# Patient Record
Sex: Male | Born: 1970 | Race: White | Hispanic: No | Marital: Married | Smoking: Never smoker
Health system: Southern US, Community
[De-identification: ages and names within clinical notes are randomized; demographics above are authoritative.]

## PROBLEM LIST (undated history)

## (undated) DIAGNOSIS — E8881 Metabolic syndrome: Secondary | ICD-10-CM

## (undated) DIAGNOSIS — G4700 Insomnia, unspecified: Secondary | ICD-10-CM

## (undated) DIAGNOSIS — N200 Calculus of kidney: Secondary | ICD-10-CM

## (undated) DIAGNOSIS — E781 Pure hyperglyceridemia: Secondary | ICD-10-CM

## (undated) DIAGNOSIS — E669 Obesity, unspecified: Secondary | ICD-10-CM

## (undated) DIAGNOSIS — I10 Essential (primary) hypertension: Secondary | ICD-10-CM

## (undated) DIAGNOSIS — E7800 Pure hypercholesterolemia, unspecified: Secondary | ICD-10-CM

## (undated) DIAGNOSIS — K5792 Diverticulitis of intestine, part unspecified, without perforation or abscess without bleeding: Secondary | ICD-10-CM

## (undated) DIAGNOSIS — E039 Hypothyroidism, unspecified: Secondary | ICD-10-CM

## (undated) HISTORY — DX: Calculus of kidney: N20.0

## (undated) HISTORY — PX: WISDOM TOOTH EXTRACTION: SHX21

## (undated) HISTORY — DX: Essential (primary) hypertension: I10

## (undated) HISTORY — DX: Metabolic syndrome: E88.810

## (undated) HISTORY — DX: Metabolic syndrome: E88.81

## (undated) HISTORY — PX: TONSILLECTOMY: SUR1361

## (undated) HISTORY — DX: Diverticulitis of intestine, part unspecified, without perforation or abscess without bleeding: K57.92

## (undated) HISTORY — DX: Pure hyperglyceridemia: E78.1

## (undated) HISTORY — PX: COLONOSCOPY: SHX174

## (undated) HISTORY — DX: Insomnia, unspecified: G47.00

---

## 2003-05-17 ENCOUNTER — Encounter: Payer: Self-pay | Admitting: Emergency Medicine

## 2003-05-17 ENCOUNTER — Emergency Department (HOSPITAL_COMMUNITY): Admission: EM | Admit: 2003-05-17 | Discharge: 2003-05-17 | Payer: Self-pay | Admitting: Emergency Medicine

## 2004-05-24 ENCOUNTER — Ambulatory Visit (HOSPITAL_BASED_OUTPATIENT_CLINIC_OR_DEPARTMENT_OTHER): Admission: RE | Admit: 2004-05-24 | Discharge: 2004-05-24 | Payer: Self-pay | Admitting: Family Medicine

## 2004-07-17 ENCOUNTER — Ambulatory Visit (HOSPITAL_COMMUNITY): Admission: RE | Admit: 2004-07-17 | Discharge: 2004-07-17 | Payer: Self-pay | Admitting: Gastroenterology

## 2004-07-17 ENCOUNTER — Encounter (INDEPENDENT_AMBULATORY_CARE_PROVIDER_SITE_OTHER): Payer: Self-pay | Admitting: Specialist

## 2004-11-14 ENCOUNTER — Observation Stay (HOSPITAL_COMMUNITY): Admission: EM | Admit: 2004-11-14 | Discharge: 2004-11-15 | Payer: Self-pay | Admitting: Emergency Medicine

## 2006-03-19 ENCOUNTER — Emergency Department (HOSPITAL_COMMUNITY): Admission: EM | Admit: 2006-03-19 | Discharge: 2006-03-20 | Payer: Self-pay | Admitting: Emergency Medicine

## 2007-05-16 ENCOUNTER — Emergency Department (HOSPITAL_COMMUNITY): Admission: EM | Admit: 2007-05-16 | Discharge: 2007-05-16 | Payer: Self-pay | Admitting: Emergency Medicine

## 2011-05-11 NOTE — Discharge Summary (Signed)
NAME:  Michael Liu, Michael Liu                 ACCOUNT NO.:  [id number]   MEDICAL RECORD NO.:  [id number]          PATIENT TYPE:  INP   LOCATION:  0375                         FACILITY:  [facility]   PHYSICIAN:  Melissa L. Ladona Ridgel, MD  DATE OF BIRTH:  06-03-1971   DATE OF ADMISSION:  11/14/2004  DATE OF DISCHARGE:  11/15/2004                                 DISCHARGE SUMMARY   DISCHARGE DIAGNOSES:  1.  Atypical chest pain, not of cardiac origin.  2.  Hypothyroidism, newly diagnosed.  3.  Hypercholesterolemia and hypertriglyceridemia.  4.  Hypertension.   MEDICATIONS AT DISCHARGE:  1.  Pepcid 20 mg b.i.d.  2.  Synthroid 0.05 mg q.d.  3.  TriCor 48 mg p.o. q.d.  4.  Accupril 20 mg q.d.   DIET:  Low fat, low cholesterol, low sodium diet.   SPECIAL INSTRUCTIONS:  Call your family doctor if you develop any recurrent  chest pain or any new changes that you feel may be related to your new  medications.   FOLLOW UP:  Follow up should be with your primary care physician in 1 to 2  weeks. Follow up with your cardiologist, Dr. Reyes Ivan, should be 3 to 4  months. Should attempt to obtain outpatient followup with an endocrinologist  or your primary care physician. Please also ask to have your hemoglobin A1c  checked as an outpatient.   HISTORY OF PRESENT ILLNESS:  The patient is a 40 year old male who presented  to the emergency room on the day of admission with chest pain. States that  the pain has been on and off for the last 2 to 3 days but felt more of a  pressure sensation as if someone's foot was on his chest. The discomfort was  relieved with nitroglycerin in the emergency room. He was therefore admitted  to telemetry for further evaluation. The patient was seen by cardiology, and  it was determined that he should perform a stress echocardiogram. Stress  echocardiogram was performed during the hospital. He was found to have a  hyperdynamic ventricle with good exercise tolerance and no evidence for  ischemia.   Pertinent laboratory values during the course of the hospitalization reveal  an elevated thyroid  stimulating hormone at 11.742 with a T4 of 6.6 within  the normal limits. The patient was also evaluated for pulmonary embolus as  the D-dimer was less than 0.22, and a CT of the chest was performed showing  no evidence for pulmonary emboli. On the day of discharge, the patient's  vital signs were stable. He was pain free.   PHYSICAL EXAMINATION:  VITAL SIGNS:  His temperature was 97.8, blood  pressure 109/79 with a pulse of 88, respirations 22. Saturations were 98%.  GENERAL:  He is a mildly obese white male in no acute distress.  HEENT: Pupils are equal, round, and reactive to light. Extraocular muscles  are intact. Mucous membranes are moist.  NECK:  Supple. There is no JVD. No lymph nodes. The left thyroid lobe did  appear slightly irregular and enlarged compared to the right with potential  palpable  nodular areas. Chest x-ray was clear to auscultation. There was no  rhonchi, rales, or wheezes.  CARDIOVASCULAR:  Regular rate and rhythm. Positive S1 and S2. No S3 or S4.  No murmurs, rubs, or gallops.  ABDOMEN:  Mildly obese, soft, nontender, nondistended with positive bowel  sounds.  EXTREMITIES:  Showed no clubbing, cyanosis, or edema.  NEUROLOGICAL:  He was nonfocal and intact.   PERTINENT LABORATORY DATA:  Pertinent laboratory values during the course of  the hospital stay reveal no elevations in his cardiac enzymes. As stated,  his TSH was 11.742. His lipid panel was 187 for total cholesterol,  triglycerides 478, his HDL was 22, and his LDL could not be calculated  because of his elevated triglycerides. His white count on admission was 7.0  with a hemoglobin of 15.1 and hematocrit of 43.2 and platelets of 200. His  sodium was 139, potassium 3.7, BUN 16, creatinine 0.9, glucose 74. His LFTs  were within normal limits.   HOSPITAL COURSE:  The patient's stress  echocardiogram was within normal  limits, and therefore, it was felt that his chest discomfort was  musculoskeletal in origin or related to gastroesophageal reflux disease. He  was therefore started on a trial of Pepcid. Since discovering his high TSH  and discussing with him symptoms that were consistent with possible  subclinical hypothyroidism, decision was made to start him on Synthroid and  have an followup endocrinology visit as an outpatient. The patient was also  started on TriCor 48 mg p.o. q.d. for his elevated triglycerides and  continued on his ACE inhibitor 20 mg q.d.   This patient was deemed stable for discharge to follow up with his primary  care physician as an outpatient and establish a relationship with an  endocrinologist to further workup his thyroid disease. I would recommend  this patient have a repeat thyroid panel in 4 to 6 weeks and that a  hemoglobin A1c be drawn to rule out glucose intolerance.   CONDITION ON DISCHARGE:  Stable.     Meli   MLT/MEDQ  D:  11/16/2004  T:  11/16/2004  Job:  [id number]   cc:   Clovis Riley, M.D.  [facility]  [facility]

## 2011-05-11 NOTE — H&P (Signed)
NAME:  Michael Liu, Michael Liu                 ACCOUNT NO.:  [id number]   MEDICAL RECORD NO.:  [id number]          PATIENT TYPE:  EMS   LOCATION:  ED                           FACILITY:  [facility]   PHYSICIAN:  Deirdre Peer. Polite, M.D. DATE OF BIRTH:  July 09, 1971   DATE OF ADMISSION:  11/14/2004  DATE OF DISCHARGE:                                HISTORY & PHYSICAL   CHIEF COMPLAINT:  Chest pain.   HISTORY OF PRESENT ILLNESS:  A 40 year old gentleman with a known history of  hypertension and obesity who presents to the ED with complaints of chest  pain.  Patient states that the chest pain has been off and on for the last 2-  3 days.  Patient describes it as a pressure sensation, as if someone's foot  is on his chest.  He does think that he occasionally feels some  palpitations.  He denies any diaphoresis.  He denies any radiation to the  arm.  Patient states that these symptoms are worsened by taking a deep  breath.  Because of the persistent nature of the symptoms, the patient  presented to the ED for evaluation.  In the ED, the patient was evaluated.  The patient had point-of-care enzymes within normal limits.  EKG without  acute abnormality.  A chest x-ray without infiltrate.  [facility]  were called for further evaluation because of the patient's description of  his chest pain and concerns for acute coronary syndrome.  At the time of my  evaluation, the patient is alert and oriented x 3.  He stated that the  pressure sensation has improved to approximately a 1/10 since being started  on IV nitroglycerin.  Patient denies any known history of coronary artery  disease or any family history of coronary artery disease.  Patient admits  that he has been able to exercise rather vigorously up until September.  Patient states that he was in a spinning class riding a bike for greater  than 30 minutes at a time without any difficulties.  He denies any orthopnea  or PND.  He denies any dyspnea on  exertion.  He denies any ankle edema.  Again, patient denies any family history of coronary artery disease.  Admission is deemed necessary for further evaluation and treatment of the  chest pain to rule out coronary artery disease.   PAST MEDICAL HISTORY:  1.  Hypertension.  2.  Low HDL.  3.  Occasional insomnia.   MEDICATIONS:  1.  Quinapril 10 mg q.d.  2.  Lunesta p.r.n.   SOCIAL HISTORY:  Negative for tobacco, alcohol, or drugs.   PAST SURGICAL HISTORY:  Negative.   ALLERGIES:  None.   FAMILY HISTORY:  Father with hypertension.  Mother with hypertension and  high cholesterol.  A sister with gestational diabetes and hypertension.  Patient has one brother.  He is unsure of what his medical condition is.   REVIEW OF SYSTEMS:  As stated in the HPI.   PHYSICAL EXAMINATION:  VITAL SIGNS:  Temperature 97.9, BP 126/96, pulse 81,  respiratory rate 20.  Sats 97%.  GENERAL:  Patient is alert and oriented x3 in no apparent distress.  HEENT:  Pupils are equal, round and reactive to light.  Anicteric sclerae.  No oral lesions.  NECK:  No nodes.  No JVD.  No carotid bruits appreciated.  LUNGS:  Clear to auscultation bilaterally.  No rales, rhonchi or rub.  CHEST:  Please note with palpation on the patient's sternum, there is  reproducible chest pain.  CARDIOVASCULAR:  Regular with no S3.  ABDOMEN:  Nontender.  EXTREMITIES:  No clubbing, cyanosis or edema.  Pulses 2+.  NEUROLOGIC:  Nonfocal.   DATA:  Chest x-ray with no apparent disease.   EKG:  No acute changes.   BMET within normal limits.  CBC within normal limits.  INR 0.8.  LFTs within  normal limits.  D-dimer pending at the time of this dictation.   ASSESSMENT:  1.  Chest pain which is reproducible by palpation, worsened by deep breath.      Also of note, the patient did get relief with sublingual nitroglycerin,      which is somewhat worrisome for coronary artery disease.  2.  Obesity.  3.  Hypertension.   Recommend  patient be admitted to a telemetry floor bed for evaluation and  treatment of chest pain to rule out acute coronary syndrome.  Will continue  serial cardiac enzymes.  Will check a TSH, check a lipid panel, check a D-  dimer.  As the patient's symptoms are worsened by deep inspiration, will  obtain a CT of the chest to rule out PE.  If the above tests are negative,  we will have cardiac evaluate for risk stratification, possibly stress  testing.  Will make further recommendations after review of the above  studies.     Joen Laura   RDP/MEDQ  D:  11/14/2004  T:  11/14/2004  Job:  [id number]

## 2011-05-11 NOTE — Op Note (Signed)
NAME:  Michael Liu, Michael Liu NO.:  [id number]   MEDICAL RECORD NO.:  [id number]                   PATIENT TYPE:  AMB   LOCATION:  ENDO                                 FACILITY:  [facility]   PHYSICIAN:  James L. Malon Kindle., M.D.          DATE OF BIRTH:  03-May-1971   DATE OF PROCEDURE:  07/17/2004  DATE OF DISCHARGE:                                 OPERATIVE REPORT   PROCEDURE:  Colonoscopy and biopsy.   MEDICATIONS:  Fentanyl 100 mcg, Versed 10 mg IV.   SCOPE:  Olympus pediatric colonoscope.   INDICATIONS:  Rectal bleeding and diarrhea of new onset.   DESCRIPTION OF PROCEDURE:  The procedure had been explained to the patient  and consent obtained.  The patient was placed in the left lateral decubitus  position.  The Olympus scope was inserted and advanced.  A digital exam was  performed.  The scope was withdrawn after the cecum was reached.  The  ileocecal valve and appendiceal orifice were seen.  The mucosa was  completely normal throughout.  The ascending, transverse, descending, and  sigmoid colon were seen well.  No evidence of colitis.  A good vascular  pattern.  No diverticular disease.  No polyps were seen.  Multiple random  biopsies were obtained.  The rectum was free of polyps.  There were internal  hemorrhoids seen in the rectum.  The scope was withdrawn, and the patient  tolerated the procedure well.   ASSESSMENT:  1. Rectal bleeding, possibly due to internal hemorrhoids.  578.1.  2. Diarrhea with normal mucosa endoscopically.  Biopsied to rule out occult     colitis.   PLAN:  Will check path.  Give a hemorrhoid instructions sheet.  See back in  the office in 4-6 weeks.                                               James L. Malon Kindle., M.D.    Waldron Session  D:  07/17/2004  T:  07/17/2004  Job:  [id number]   cc:   Sigmund Hazel, M.D.  [address]  [address] [place], [place] [address]  Fax: [phone]

## 2011-05-11 NOTE — Consult Note (Signed)
NAME:  Michael Liu, Michael Liu                 ACCOUNT NO.:  [id number]   MEDICAL RECORD NO.:  [id number]          PATIENT TYPE:  INP   LOCATION:  0101                         FACILITY:  [facility]   PHYSICIAN:  Elmore Guise., M.D.DATE OF BIRTH:  [birth year]   DATE OF CONSULTATION:  11/14/2004  DATE OF DISCHARGE:                                   CONSULTATION   REASON FOR CONSULTATION:  Chest pain.   HISTORY OF PRESENT ILLNESS:  Patient is a very pleasant 40 year old white  male with a past medical history of recently diagnosed hypertension.  Presents with an eight-month history of inspiratory off and on chest pain.  The patient's chest pain started approximately in the end of February when  he was initially diagnosed with bronchitis.  Does report some improvement  after treatment with an unknown antibiotic; however, the pain became  recurrent over the last couple of months.  Notes it was worse with  activity and worse with deep breathing.  He used to exercise approximately 4-  5 times per week with hard aerobic activity, either riding a bike or  treadmill or other aerobic activities; however, he has lessened my activity  level over the last couple of months.  Today he had recurrent episodes of  chest pain, again, worse with deep inspiration or with increased stress  levels, primarily stress with business or emotional stress.  Denies any  significant exertional component with normal ambulation.  Reports pain is  also worse with heavy lifting.  His chest pain also is off and on and worse  with palpation to the right upper sternal area as well as mild decrease in  catch in inspiratory capacity with pain in the lower left thoracic area.  Patient is currently chest painfree; however, has active pain with continued  deep inspiration.   REVIEW OF SYSTEMS:  Positive for recent scuba diving trip in October,  otherwise negative.  Has recently started on Quinapril from his primary care  physician.   CURRENT MEDICATIONS:  Accupril 10 mg daily.   ALLERGIES:  None.   FAMILY HISTORY:  Positive for hypertension.   SOCIAL HISTORY:  Married with one child.  No tobacco.  Has occasional  alcohol use, approximately 1-2 times per year.  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   PHYSICAL EXAMINATION:  VITAL SIGNS:  He is afebrile.  Blood pressure 120-  130/60-70.  Heart rate is in the 70s.  Satting at 100% on room air.  GENERAL:  He is a very pleasant young white male who is alert and oriented x  4 in no acute distress.  NECK:  Supple.  No lymphadenopathy.  Carotids are 2+.  No JVD.  No bruits.  LUNGS:  Clear.  HEART:  Regular with a normal S1 and S2.  No rub noted.  ABDOMEN:  Obese, soft, nontender, nondistended.  EXTREMITIES:  Warm with 2+ pulses.  No edema.   Lab work shows a white count of 7.0, hemoglobin 15.1, platelets 200.  BUN  and creatinine are 16 and 0.9.  Potassium is 3.7.  LFTs  are normal.  Coags  are normal.  D-dimer is less than 0.22.  MB was 1.1, 1.4, and 1.0.  Myoglobin is 45, 39, and 38.  Troponin I is less than 0.05 x3.   Chest x-ray shows no acute cardiopulmonary disease, and preliminary CT of  the chest is negative for PE.  Radiology read is not yet performed.   ECG shows normal sinus rhythm, 74 per minute.  No significant ST/T wave  changes noted.   IMPRESSION:  1.  Metabolic syndrome.  2.  Chest pain.   PLAN:  From a cardiovascular standpoint, the patient with typical and  atypical features to his chest pain.  I do feel this is probably chest-wall  related; however, because of his elevated lipid status as well as  hypertension and borderline obesity, I would recommend an exercise echo in  the morning to evaluate for ischemic heart disease.  If exercise echo is  negative or at low risk, would recommend aggressive blood pressure control  and lipid management, otherwise would start Toradol and p.o. NSAIDs for  recurrent symptoms.  Patient will be  followed up in the morning.      TWK/MEDQ  D:  11/14/2004  T:  11/14/2004  Job:  [id number]

## 2011-12-29 ENCOUNTER — Ambulatory Visit: Payer: Managed Care, Other (non HMO)

## 2011-12-29 DIAGNOSIS — I10 Essential (primary) hypertension: Secondary | ICD-10-CM

## 2011-12-29 DIAGNOSIS — J019 Acute sinusitis, unspecified: Secondary | ICD-10-CM

## 2011-12-29 DIAGNOSIS — R05 Cough: Secondary | ICD-10-CM

## 2013-01-12 ENCOUNTER — Ambulatory Visit (INDEPENDENT_AMBULATORY_CARE_PROVIDER_SITE_OTHER): Payer: Managed Care, Other (non HMO) | Admitting: *Deleted

## 2013-01-12 DIAGNOSIS — M79609 Pain in unspecified limb: Secondary | ICD-10-CM

## 2013-07-19 ENCOUNTER — Ambulatory Visit: Payer: Managed Care, Other (non HMO) | Admitting: Internal Medicine

## 2013-07-19 VITALS — BP 122/78 | HR 75 | Temp 97.9°F | Resp 16 | Ht 59.5 in | Wt 264.4 lb

## 2013-07-19 DIAGNOSIS — Z6841 Body Mass Index (BMI) 40.0 and over, adult: Secondary | ICD-10-CM | POA: Insufficient documentation

## 2013-07-19 DIAGNOSIS — J069 Acute upper respiratory infection, unspecified: Secondary | ICD-10-CM

## 2013-07-19 NOTE — Progress Notes (Signed)
  Subjective:    Patient ID: Michael Liu, male    DOB: 02-13-1971, 42 y.o.   MRN: [id number]  HPI complaining of ear pressure, nasal congestion, sore throat, and low-grade fever for 3 days Son with the same illness No cough No GI upset   Review of Systems     Objective:   Physical Exam Conjunctiva clear TMs clear Nares with clear rhinorrhea Throat clear No nodes       Assessment & Plan:  Viral URI  Over-the-counter medicines

## 2014-10-10 ENCOUNTER — Emergency Department (HOSPITAL_COMMUNITY)
Admission: EM | Admit: 2014-10-10 | Discharge: 2014-10-10 | Disposition: A | Discharge status: Home / Self Care | Payer: Managed Care, Other (non HMO) | Attending: Emergency Medicine | Admitting: Emergency Medicine

## 2014-10-10 ENCOUNTER — Encounter (HOSPITAL_COMMUNITY): Payer: Self-pay | Admitting: Emergency Medicine

## 2014-10-10 ENCOUNTER — Emergency Department (HOSPITAL_COMMUNITY): Payer: Managed Care, Other (non HMO)

## 2014-10-10 DIAGNOSIS — Y9289 Other specified places as the place of occurrence of the external cause: Secondary | ICD-10-CM | POA: Insufficient documentation

## 2014-10-10 DIAGNOSIS — Z79899 Other long term (current) drug therapy: Secondary | ICD-10-CM | POA: Diagnosis not present

## 2014-10-10 DIAGNOSIS — S3992XA Unspecified injury of lower back, initial encounter: Secondary | ICD-10-CM | POA: Insufficient documentation

## 2014-10-10 DIAGNOSIS — W109XXA Fall (on) (from) unspecified stairs and steps, initial encounter: Secondary | ICD-10-CM | POA: Insufficient documentation

## 2014-10-10 DIAGNOSIS — I10 Essential (primary) hypertension: Secondary | ICD-10-CM | POA: Insufficient documentation

## 2014-10-10 DIAGNOSIS — E780 Pure hypercholesterolemia: Secondary | ICD-10-CM | POA: Insufficient documentation

## 2014-10-10 DIAGNOSIS — Y9389 Activity, other specified: Secondary | ICD-10-CM | POA: Diagnosis not present

## 2014-10-10 DIAGNOSIS — M5441 Lumbago with sciatica, right side: Secondary | ICD-10-CM

## 2014-10-10 DIAGNOSIS — W108XXA Fall (on) (from) other stairs and steps, initial encounter: Secondary | ICD-10-CM

## 2014-10-10 HISTORY — DX: Pure hypercholesterolemia, unspecified: E78.00

## 2014-10-10 MED ORDER — TRAMADOL HCL 50 MG PO TABS: 50.0000 mg | ORAL_TABLET | Freq: Four times a day (QID) | ORAL | Status: DC | PRN

## 2014-10-10 MED ORDER — METHOCARBAMOL 500 MG PO TABS
500.0000 mg | ORAL_TABLET | Freq: Once | ORAL | Status: AC
  Administered 2014-10-10: 500 mg via ORAL
  Filled 2014-10-10: qty 1

## 2014-10-10 MED ORDER — METHOCARBAMOL 500 MG PO TABS: 500.0000 mg | ORAL_TABLET | Freq: Three times a day (TID) | ORAL | Status: DC | PRN

## 2014-10-10 MED ORDER — TRAMADOL HCL 50 MG PO TABS
50.0000 mg | ORAL_TABLET | Freq: Once | ORAL | Status: AC
  Administered 2014-10-10: 50 mg via ORAL
  Filled 2014-10-10: qty 1

## 2014-10-10 MED ORDER — TRAMADOL HCL 50 MG PO TABS: 50.0000 mg | ORAL_TABLET | Freq: Once | ORAL | Status: DC

## 2014-10-10 NOTE — ED Provider Notes (Signed)
CSN: [id number]     Arrival date & time 10/10/14  2116 History  This chart was scribed for non-physician practitioner, Noland Fordyce, PA-C working with Ezequiel Essex, MD by Frederich Balding, ED scribe. This patient was seen in room TR09C/TR09C and the patient's care was started at 10:32 PM.   Chief Complaint  Patient presents with  . Fall   The history is provided by the patient. No language interpreter was used.   HPI Comments: Michael Liu is a 43 y.o. male who presents to the Emergency Department complaining of a fall that occurred around 2:30 PM today. States he slipped in socks on a hardwood floor and fell down about 6 steps, landing on his right side and buttock. Denies hitting his head or LOC. Reports lower back pain that radiates into his right leg with bearing weight. Rates pain 8/10 and describes it as a spasm. He has used ice and taken aleve with little relief. Denies history of back surgeries. States he called his PCP who recommended he come to ED for x-rays. Denies change in bowel or bladder habits. Denies numbness or tingling in groin or legs.   Past Medical History  Diagnosis Date  . Hypertension   . High cholesterol    History reviewed. No pertinent past surgical history. Family History  Problem Relation Age of Onset  . Hypertension Father    History  Substance Use Topics  . Smoking status: Never Smoker   . Smokeless tobacco: Not on file  . Alcohol Use: No    Review of Systems  Musculoskeletal: Positive for back pain and myalgias.  All other systems reviewed and are negative.  Allergies  Percocet  Home Medications   Prior to Admission medications   Medication Sig Start Date End Date Taking? Authorizing Provider  amLODipine-valsartan (EXFORGE) 10-320 MG per tablet Take 1 tablet by mouth daily.   Yes Historical Provider, MD  atorvastatin (LIPITOR) 10 MG tablet Take 10 mg by mouth daily.   Yes Historical Provider, MD  levothyroxine (SYNTHROID, LEVOTHROID) 75  MCG tablet Take 75 mcg by mouth daily before breakfast.   Yes Historical Provider, MD  potassium chloride SA (K-DUR,KLOR-CON) 20 MEQ tablet Take 20 mEq by mouth 2 (two) times daily.   Yes Historical Provider, MD  zolpidem (AMBIEN CR) 12.5 MG CR tablet Take 12.5 mg by mouth at bedtime as needed for sleep.   Yes Historical Provider, MD  methocarbamol (ROBAXIN) 500 MG tablet Take 1 tablet (500 mg total) by mouth every 8 (eight) hours as needed for muscle spasms. 10/10/14   Noland Fordyce, PA-C  traMADol (ULTRAM) 50 MG tablet Take 1 tablet (50 mg total) by mouth every 6 (six) hours as needed. 10/10/14   Noland Fordyce, PA-C   BP 155/97  Pulse 58  Temp(Src) 98 F (36.7 C) (Oral)  Resp 18  Ht 5\' 9"  (1.753 m)  Wt 258 lb (117.028 kg)  BMI 38.08 kg/m2  SpO2 98%  Physical Exam  Nursing note and vitals reviewed. Constitutional: He is oriented to person, place, and time. He appears well-developed and well-nourished.  HENT:  Head: Normocephalic and atraumatic.  Eyes: EOM are normal.  Neck: Normal range of motion.  Cardiovascular: Normal rate.   Pulmonary/Chest: Effort normal.  Musculoskeletal: Normal range of motion. He exhibits tenderness.  Tender on lumbar spine near L4-S1 and right lumbar musculature with superficial abrasion. No ecchymosis.   Neurological: He is alert and oriented to person, place, and time.  Antalgic gait.  Skin: Skin  is warm and dry.  Psychiatric: He has a normal mood and affect. His behavior is normal.    ED Course  Procedures (including critical care time)  DIAGNOSTIC STUDIES: Oxygen Saturation is 98% on RA, normal by my interpretation.    COORDINATION OF CARE: 10:33 PM-Discussed treatment plan which includes xrays with pt at bedside and pt agreed to plan.   Labs Review Labs Reviewed - No data to display  Imaging Review Dg Lumbar Spine Complete  10/10/2014   CLINICAL DATA:  Patient fell down stairs while wearing socks. Landed on sacral coccygeal region.  Abrasions. Right flank pain.  EXAM: LUMBAR SPINE - COMPLETE 4+ VIEW  COMPARISON:  CT abdomen and pelvis 05/17/2007.  FINDINGS: Chronic degenerative changes in the lumbar spine with bridging anterior osteophytes. Reversal of the usual lumbar lordosis is likely degenerative. These changes are stable since previous CT scan. There is no evidence of lumbar spine fracture. Alignment is normal. Intervertebral disc spaces are maintained.  IMPRESSION: Degenerative changes.  No acute bony abnormalities.   Electronically Signed   By: Lucienne Capers M.D.   On: 10/10/2014 23:08   Dg Sacrum/coccyx  10/10/2014   CLINICAL DATA:  43 year old male fell down steps to basement while wearing socks. Initial encounter. Acute sacrum and coccyx abrasion and pain.  EXAM: SACRUM AND COCCYX - 2+ VIEW  COMPARISON:  CT Abdomen and Pelvis 05/17/2007.  FINDINGS: Bone mineralization is within normal limits. Sacral ala and SI joints within normal limits. On the lateral view coccygeal segments appear stable. Visible lower lumbar levels appear stable. Grossly intact visible pelvis.  IMPRESSION: No acute fracture or dislocation identified about the sacrum or coccyx.   Electronically Signed   By: Lars Pinks M.D.   On: 10/10/2014 23:09     EKG Interpretation None      MDM   Final diagnoses:  Right-sided low back pain with right-sided sciatica  Fall down stairs, initial encounter   Pt c/o lower back pain and right sided buttock pain. No red flag symptoms. Not concerned for cuada equina syndrome. Plain films: no evidence of lumbar fracture. No fracture or dislocation of sacrum or coccyx. Will tx for muscular pain. Rx: tramadol and robaxin. Advised to take OTC NSAIDs. Advised to f/u with PCP in 3-4 days recheck symptoms if not improving. Return precautions provided. Pt verbalized understanding and agreement with tx plan.   I personally performed the services described in this documentation, which was scribed in my presence. The  recorded information has been reviewed and is accurate.  Noland Fordyce, PA-C 10/10/14 [place], PA-C 10/10/14 [id number]

## 2014-10-10 NOTE — ED Notes (Signed)
Pt slipped in socks on hardwood steps and fell down approx 6 steps around 2:30pm today.  C/o pain to lower back that radiates down R leg while walking.  Denies LOC.

## 2014-10-10 NOTE — ED Notes (Signed)
PA at bedside.

## 2014-10-10 NOTE — ED Notes (Signed)
Patient transported to X-ray 

## 2014-10-11 NOTE — ED Provider Notes (Signed)
Medical screening examination/treatment/procedure(s) were performed by non-physician practitioner and as supervising physician I was immediately available for consultation/collaboration.   EKG Interpretation None        Ezequiel Essex, MD 10/11/14 0006

## 2014-12-15 ENCOUNTER — Ambulatory Visit (HOSPITAL_COMMUNITY)
Admission: RE | Admit: 2014-12-15 | Discharge: 2014-12-15 | Disposition: A | Discharge status: Home / Self Care | Payer: Managed Care, Other (non HMO) | Source: Ambulatory Visit | Attending: Family | Admitting: Family

## 2014-12-15 ENCOUNTER — Other Ambulatory Visit (HOSPITAL_COMMUNITY): Payer: Self-pay | Admitting: Internal Medicine

## 2014-12-15 DIAGNOSIS — R600 Localized edema: Secondary | ICD-10-CM

## 2014-12-15 NOTE — Progress Notes (Signed)
Preliminary results phoned and given to DJ at [place].

## 2016-08-13 ENCOUNTER — Encounter (HOSPITAL_COMMUNITY): Payer: Self-pay

## 2016-08-13 ENCOUNTER — Emergency Department (HOSPITAL_COMMUNITY)
Admission: EM | Admit: 2016-08-13 | Discharge: 2016-08-14 | Disposition: A | Discharge status: Home / Self Care | Payer: Managed Care, Other (non HMO) | Attending: Emergency Medicine | Admitting: Emergency Medicine

## 2016-08-13 ENCOUNTER — Emergency Department (HOSPITAL_COMMUNITY): Payer: Managed Care, Other (non HMO)

## 2016-08-13 DIAGNOSIS — N23 Unspecified renal colic: Secondary | ICD-10-CM

## 2016-08-13 DIAGNOSIS — N201 Calculus of ureter: Secondary | ICD-10-CM | POA: Insufficient documentation

## 2016-08-13 DIAGNOSIS — K5792 Diverticulitis of intestine, part unspecified, without perforation or abscess without bleeding: Secondary | ICD-10-CM | POA: Diagnosis not present

## 2016-08-13 DIAGNOSIS — I10 Essential (primary) hypertension: Secondary | ICD-10-CM | POA: Diagnosis not present

## 2016-08-13 DIAGNOSIS — R1032 Left lower quadrant pain: Secondary | ICD-10-CM | POA: Diagnosis present

## 2016-08-13 LAB — COMPREHENSIVE METABOLIC PANEL
ALBUMIN: 3.9 g/dL (ref 3.5–5.0)
ALT: 30 U/L (ref 17–63)
AST: 21 U/L (ref 15–41)
Alkaline Phosphatase: 110 U/L (ref 38–126)
Anion gap: 6 (ref 5–15)
BUN: 16 mg/dL (ref 6–20)
CO2: 26 mmol/L (ref 22–32)
Calcium: 9 mg/dL (ref 8.9–10.3)
Chloride: 105 mmol/L (ref 101–111)
Creatinine, Ser: 0.88 mg/dL (ref 0.61–1.24)
GFR calc Af Amer: >60 mL/min (ref >60)
GFR calc non Af Amer: >60 mL/min (ref >60)
Glucose, Bld: 95 mg/dL (ref 65–99)
Potassium: 3.1 mmol/L — ABNORMAL LOW (ref 3.5–5.1)
Sodium: 137 mmol/L (ref 135–145)
Total Bilirubin: 1.2 mg/dL (ref 0.3–1.2)
Total Protein: 7.1 g/dL (ref 6.5–8.1)

## 2016-08-13 LAB — URINALYSIS, ROUTINE W REFLEX MICROSCOPIC
Glucose, UA: NEGATIVE mg/dL
KETONES UR: 15 mg/dL — AB
Leukocytes, UA: NEGATIVE
Nitrite: NEGATIVE
PH: 6 (ref 5.0–8.0)
Protein, ur: 30 mg/dL — AB
Specific Gravity, Urine: 1.034 — ABNORMAL HIGH (ref 1.005–1.030)

## 2016-08-13 LAB — URINE MICROSCOPIC-ADD ON: WBC, UA: NONE SEEN WBC/hpf (ref 0–5)

## 2016-08-13 LAB — CBC
HCT: 41.2 % (ref 39.0–52.0)
Hemoglobin: 13.7 g/dL (ref 13.0–17.0)
MCH: 29.3 pg (ref 26.0–34.0)
MCHC: 33.3 g/dL (ref 30.0–36.0)
MCV: 88 fL (ref 78.0–100.0)
PLATELETS: 168 10*3/uL (ref 150–400)
RBC: 4.68 MIL/uL (ref 4.22–5.81)
RDW: 13.6 % (ref 11.5–15.5)
WBC: 17.3 10*3/uL — ABNORMAL HIGH (ref 4.0–10.5)

## 2016-08-13 LAB — LIPASE, BLOOD: Lipase: 21 U/L (ref 11–51)

## 2016-08-13 MED ORDER — ONDANSETRON 8 MG PO TBDP: 8.0000 mg | ORAL_TABLET | Freq: Three times a day (TID) | ORAL | 0 refills | Status: DC | PRN

## 2016-08-13 MED ORDER — CIPROFLOXACIN HCL 500 MG PO TABS: 500.0000 mg | ORAL_TABLET | Freq: Two times a day (BID) | ORAL | 0 refills | Status: DC

## 2016-08-13 MED ORDER — METRONIDAZOLE 500 MG PO TABS: 500.0000 mg | ORAL_TABLET | Freq: Two times a day (BID) | ORAL | 0 refills | Status: DC

## 2016-08-13 MED ORDER — METRONIDAZOLE 500 MG PO TABS
500.0000 mg | ORAL_TABLET | Freq: Once | ORAL | Status: AC
  Administered 2016-08-14: 500 mg via ORAL
  Filled 2016-08-13: qty 1

## 2016-08-13 MED ORDER — FENTANYL CITRATE (PF) 100 MCG/2ML IJ SOLN
75.0000 ug | Freq: Once | INTRAMUSCULAR | Status: AC
  Administered 2016-08-13: 75 ug via INTRAVENOUS
  Filled 2016-08-13: qty 2

## 2016-08-13 MED ORDER — ONDANSETRON HCL 4 MG/2ML IJ SOLN
4.0000 mg | Freq: Once | INTRAMUSCULAR | Status: AC
  Administered 2016-08-13: 4 mg via INTRAVENOUS
  Filled 2016-08-13: qty 2

## 2016-08-13 MED ORDER — CIPROFLOXACIN HCL 500 MG PO TABS
500.0000 mg | ORAL_TABLET | Freq: Once | ORAL | Status: AC
  Administered 2016-08-14: 500 mg via ORAL
  Filled 2016-08-13: qty 1

## 2016-08-13 MED ORDER — IOPAMIDOL (ISOVUE-300) INJECTION 61%
INTRAVENOUS | Status: AC
Start: 2016-08-13 — End: 2016-08-13
  Administered 2016-08-13: 100 mL
  Filled 2016-08-13: qty 100

## 2016-08-13 MED ORDER — HYDROCODONE-ACETAMINOPHEN 5-325 MG PO TABS: 1.0000 | ORAL_TABLET | Freq: Four times a day (QID) | ORAL | 0 refills | Status: DC | PRN

## 2016-08-13 NOTE — ED Triage Notes (Signed)
Pt states seen by PCP today. Dx with possible diverticulitis. Pt complaining of lower abdominal pain and left testicular soreness. Pt denies any discharge or bleeding. Pt denies any diarrhea, nausea or vomiting.

## 2016-08-14 MED ORDER — KETOROLAC TROMETHAMINE 30 MG/ML IJ SOLN
15.0000 mg | Freq: Once | INTRAMUSCULAR | Status: AC
  Administered 2016-08-14: 15 mg via INTRAVENOUS
  Filled 2016-08-14: qty 1

## 2016-08-14 MED ORDER — HYDROCODONE-ACETAMINOPHEN 5-325 MG PO TABS
1.0000 | ORAL_TABLET | Freq: Once | ORAL | Status: AC
Start: 2016-08-14 — End: 2016-08-14
  Administered 2016-08-14: 1 via ORAL
  Filled 2016-08-14: qty 1

## 2016-08-14 NOTE — ED Notes (Signed)
Dr. Nanavati at bedside 

## 2016-08-14 NOTE — ED Provider Notes (Signed)
[facility] DEPT Provider Note   CSN: [place]:[id number] Arrival date & time: 08/13/16  1738     History   Chief Complaint Chief Complaint  Patient presents with  . Abdominal Pain    HPI Michael Liu is a 45 y.o. male.  HPI  Pt comes in with cc of abd pain. Abd pain is located in the LLQ and started few days ago. Pt also has pain radiating to his scrotum. Denies any hx of kidney stones or similar pain in the past. Pt has no uti like symptoms. Deneis any bloody stools or diarrhea. No fevers, chills. No known diverticular dz.  Past Medical History:  Diagnosis Date  . High cholesterol   . Hypertension     Patient Active Problem List   Diagnosis Date Noted  . BMI 50.0-59.9, adult ([place]) 07/19/2013    History reviewed. No pertinent surgical history.     Home Medications    Prior to Admission medications   Medication Sig Start Date End Date Taking? Authorizing Provider  amLODipine (NORVASC) 5 MG tablet Take 5 mg by mouth daily. 07/01/16  Yes Historical Provider, MD  atorvastatin (LIPITOR) 20 MG tablet Take 20 mg by mouth every morning. 08/02/16  Yes Historical Provider, MD  levothyroxine (SYNTHROID, LEVOTHROID) 75 MCG tablet Take 75 mcg by mouth daily before breakfast.   Yes Historical Provider, MD  metoprolol succinate (TOPROL-XL) 100 MG 24 hr tablet Take 100 mg by mouth daily. 06/19/16  Yes Historical Provider, MD  potassium chloride SA (K-DUR,KLOR-CON) 20 MEQ tablet Take 20 mEq by mouth every morning.    Yes Historical Provider, MD  zolpidem (AMBIEN CR) 12.5 MG CR tablet Take 12.5 mg by mouth at bedtime.    Yes Historical Provider, MD  ciprofloxacin (CIPRO) 500 MG tablet Take 1 tablet (500 mg total) by mouth 2 (two) times daily. 08/13/16   Varney Biles, MD  HYDROcodone-acetaminophen (NORCO/VICODIN) 5-325 MG tablet Take 1 tablet by mouth every 6 (six) hours as needed. 08/13/16   Varney Biles, MD  metroNIDAZOLE (FLAGYL) 500 MG tablet Take 1 tablet (500 mg total) by mouth 2  (two) times daily. 08/13/16   Varney Biles, MD  ondansetron (ZOFRAN ODT) 8 MG disintegrating tablet Take 1 tablet (8 mg total) by mouth every 8 (eight) hours as needed for nausea. 08/13/16   Varney Biles, MD    Family History Family History  Problem Relation Age of Onset  . Hypertension Father     Social History Social History  Substance Use Topics  . Smoking status: Never Smoker  . Smokeless tobacco: Never Used  . Alcohol use No     Allergies   Percocet [oxycodone-acetaminophen]   Review of Systems Review of Systems  ROS 10 Systems reviewed and are negative for acute change except as noted in the HPI.     Physical Exam Updated Vital Signs BP 127/88   Pulse 71   Temp 98.9 F (37.2 C) (Oral)   Resp 16   Ht 5\' 9"  (1.753 m)   Wt 275 lb (124.7 kg)   SpO2 96%   BMI 40.61 kg/m   Physical Exam  Constitutional: He is oriented to person, place, and time. He appears well-developed.  HENT:  Head: Normocephalic and atraumatic.  Eyes: Conjunctivae and EOM are normal. Pupils are equal, round, and reactive to light.  Neck: Normal range of motion. Neck supple.  Cardiovascular: Normal rate, regular rhythm and normal heart sounds.   Pulmonary/Chest: Effort normal and breath sounds normal. No respiratory distress.  He has no wheezes.  Abdominal: Soft. Bowel sounds are normal. He exhibits no distension. There is tenderness. There is no rebound and no guarding.  LLQ focal tenderness  Genitourinary: Penis normal.  Genitourinary Comments: Testes free moving, no hernia  Neurological: He is alert and oriented to person, place, and time.  Skin: Skin is warm.  Nursing note and vitals reviewed.    ED Treatments / Results  Labs (all labs ordered are listed, but only abnormal results are displayed) Labs Reviewed  COMPREHENSIVE METABOLIC PANEL - Abnormal; Notable for the following:       Result Value   Potassium 3.1 (*)    All other components within normal limits  CBC -  Abnormal; Notable for the following:    WBC 17.3 (*)    All other components within normal limits  URINALYSIS, ROUTINE W REFLEX MICROSCOPIC (NOT AT [facility]) - Abnormal; Notable for the following:    Color, Urine AMBER (*)    APPearance CLOUDY (*)    Specific Gravity, Urine 1.034 (*)    Hgb urine dipstick TRACE (*)    Bilirubin Urine SMALL (*)    Ketones, ur 15 (*)    Protein, ur 30 (*)    All other components within normal limits  URINE MICROSCOPIC-ADD ON - Abnormal; Notable for the following:    Squamous Epithelial / LPF 0-5 (*)    Bacteria, UA RARE (*)    Casts GRANULAR CAST (*)    All other components within normal limits  LIPASE, BLOOD    EKG  EKG Interpretation None       Radiology Ct Abdomen Pelvis W Contrast  Result Date: 08/13/2016 CLINICAL DATA:  Left lower quadrant abdominal pain EXAM: CT ABDOMEN AND PELVIS WITH CONTRAST TECHNIQUE: Multidetector CT imaging of the abdomen and pelvis was performed using the standard protocol following bolus administration of intravenous contrast. CONTRAST:  148mL ISOVUE-300 IOPAMIDOL (ISOVUE-300) INJECTION 61% COMPARISON:  05/17/2007 FINDINGS: Lower chest and abdominal wall: No acute finding. Fatty left inguinal hernia. Hepatobiliary: No focal liver abnormality.No evidence of biliary obstruction or stone. Pancreas: Unremarkable. Spleen: Unremarkable. Adrenals/Urinary Tract: Negative adrenals. Punctate left mid ureteral stone without hydronephrosis. Bilateral renal cysts, simple appearing. Largest cyst measures 54 mm in the lower pole left kidney. Unremarkable bladder. Stomach/Bowel: Colonic diverticulosis with active inflammation around sigmoid diverticula. Regional colonic wall thickening with low-density edematous appearance. No pneumoperitoneum or abscess. No bowel obstruction. No appendicitis. 9 mm fatty structure in the distal duodenum was not seen previously and is presumably ingested. Reproductive:No pathologic findings. Vascular/Lymphatic:  No acute vascular abnormality. No mass or adenopathy. Other: No ascites or pneumoperitoneum. Musculoskeletal: Stable benign sclerotic focus in the left femoral neck measuring 13 mm. IMPRESSION: 1. Sigmoid diverticulitis without abscess or pneumoperitoneum. 2. 1 mm left mid ureteral calculus without hydronephrosis. Electronically Signed   By: Monte Fantasia M.D.   On: 08/13/2016 23:41    Procedures Procedures (including critical care time)  Medications Ordered in ED Medications  fentaNYL (SUBLIMAZE) injection 75 mcg (75 mcg Intravenous Given 08/13/16 2250)  ondansetron (ZOFRAN) injection 4 mg (4 mg Intravenous Given 08/13/16 2250)  iopamidol (ISOVUE-300) 61 % injection (100 mLs  Contrast Given 08/13/16 2307)  ciprofloxacin (CIPRO) tablet 500 mg (500 mg Oral Given 08/14/16 0003)  metroNIDAZOLE (FLAGYL) tablet 500 mg (500 mg Oral Given 08/14/16 0003)  ketorolac (TORADOL) 30 MG/ML injection 15 mg (15 mg Intravenous Given 08/14/16 0011)  HYDROcodone-acetaminophen (NORCO/VICODIN) 5-325 MG per tablet 1 tablet (1 tablet Oral Given 08/14/16 0011)  Initial Impression / Assessment and Plan / ED Course  I have reviewed the triage vital signs and the nursing notes.  Pertinent labs & imaging results that were available during my care of the patient were reviewed by me and considered in my medical decision making (see chart for details).  Clinical Course   Pt with LLQ abd pain and scrotal pain. No hx of diverticular dz, has localized severe abd tenderness with elevated WC. CT confirms uncomplicated diverticulitis. Also, pt had some scrotal pain, and the CT scan shows ureteral colic, 35mm. Will start antibiotics. Strict ER return precautions have been discussed, and patient is agreeing with the plan and is comfortable with the workup done and the recommendations from the ER.     Final Clinical Impressions(s) / ED Diagnoses   Final diagnoses:  Acute diverticulitis  Ureteral colic    New  Prescriptions New Prescriptions   CIPROFLOXACIN (CIPRO) 500 MG TABLET    Take 1 tablet (500 mg total) by mouth 2 (two) times daily.   HYDROCODONE-ACETAMINOPHEN (NORCO/VICODIN) 5-325 MG TABLET    Take 1 tablet by mouth every 6 (six) hours as needed.   METRONIDAZOLE (FLAGYL) 500 MG TABLET    Take 1 tablet (500 mg total) by mouth 2 (two) times daily.   ONDANSETRON (ZOFRAN ODT) 8 MG DISINTEGRATING TABLET    Take 1 tablet (8 mg total) by mouth every 8 (eight) hours as needed for nausea.     Varney Biles, MD 08/14/16 0021

## 2017-03-02 ENCOUNTER — Inpatient Hospital Stay (HOSPITAL_BASED_OUTPATIENT_CLINIC_OR_DEPARTMENT_OTHER)
Admission: EM | Admit: 2017-03-02 | Discharge: 2017-03-05 | DRG: 392 | Disposition: A | Discharge status: Home / Self Care | Payer: PRIVATE HEALTH INSURANCE | Attending: Internal Medicine | Admitting: Internal Medicine

## 2017-03-02 ENCOUNTER — Encounter (HOSPITAL_BASED_OUTPATIENT_CLINIC_OR_DEPARTMENT_OTHER): Payer: Self-pay | Admitting: Emergency Medicine

## 2017-03-02 ENCOUNTER — Emergency Department (HOSPITAL_BASED_OUTPATIENT_CLINIC_OR_DEPARTMENT_OTHER): Payer: PRIVATE HEALTH INSURANCE

## 2017-03-02 DIAGNOSIS — E039 Hypothyroidism, unspecified: Secondary | ICD-10-CM | POA: Diagnosis present

## 2017-03-02 DIAGNOSIS — Z8249 Family history of ischemic heart disease and other diseases of the circulatory system: Secondary | ICD-10-CM | POA: Diagnosis not present

## 2017-03-02 DIAGNOSIS — E669 Obesity, unspecified: Secondary | ICD-10-CM | POA: Diagnosis present

## 2017-03-02 DIAGNOSIS — Q613 Polycystic kidney, unspecified: Secondary | ICD-10-CM

## 2017-03-02 DIAGNOSIS — Z6841 Body Mass Index (BMI) 40.0 and over, adult: Secondary | ICD-10-CM | POA: Diagnosis not present

## 2017-03-02 DIAGNOSIS — R1032 Left lower quadrant pain: Secondary | ICD-10-CM | POA: Diagnosis not present

## 2017-03-02 DIAGNOSIS — K5720 Diverticulitis of large intestine with perforation and abscess without bleeding: Secondary | ICD-10-CM | POA: Diagnosis present

## 2017-03-02 DIAGNOSIS — Z833 Family history of diabetes mellitus: Secondary | ICD-10-CM

## 2017-03-02 DIAGNOSIS — E785 Hyperlipidemia, unspecified: Secondary | ICD-10-CM | POA: Diagnosis present

## 2017-03-02 DIAGNOSIS — E876 Hypokalemia: Secondary | ICD-10-CM | POA: Diagnosis present

## 2017-03-02 DIAGNOSIS — E7800 Pure hypercholesterolemia, unspecified: Secondary | ICD-10-CM | POA: Diagnosis present

## 2017-03-02 DIAGNOSIS — I10 Essential (primary) hypertension: Secondary | ICD-10-CM | POA: Diagnosis present

## 2017-03-02 DIAGNOSIS — Z87442 Personal history of urinary calculi: Secondary | ICD-10-CM

## 2017-03-02 HISTORY — DX: Hypothyroidism, unspecified: E03.9

## 2017-03-02 HISTORY — DX: Obesity, unspecified: E66.9

## 2017-03-02 LAB — CBC WITH DIFFERENTIAL/PLATELET
BASOS ABS: 0 10*3/uL (ref 0.0–0.1)
Basophils Relative: 0 %
EOS PCT: 0 %
Eosinophils Absolute: 0 10*3/uL (ref 0.0–0.7)
HEMATOCRIT: 40.9 % (ref 39.0–52.0)
Hemoglobin: 14.1 g/dL (ref 13.0–17.0)
LYMPHS PCT: 10 %
Lymphs Abs: 1.3 10*3/uL (ref 0.7–4.0)
MCH: 29.3 pg (ref 26.0–34.0)
MCHC: 34.5 g/dL (ref 30.0–36.0)
MCV: 85 fL (ref 78.0–100.0)
Monocytes Absolute: 0.7 10*3/uL (ref 0.1–1.0)
Monocytes Relative: 5 %
NEUTROS ABS: 11.1 10*3/uL — AB (ref 1.7–7.7)
Neutrophils Relative %: 85 %
PLATELETS: 179 10*3/uL (ref 150–400)
RBC: 4.81 MIL/uL (ref 4.22–5.81)
RDW: 13.8 % (ref 11.5–15.5)
WBC: 13.1 10*3/uL — AB (ref 4.0–10.5)

## 2017-03-02 LAB — COMPREHENSIVE METABOLIC PANEL
ALT: 25 U/L (ref 17–63)
AST: 22 U/L (ref 15–41)
Albumin: 4.1 g/dL (ref 3.5–5.0)
Alkaline Phosphatase: 110 U/L (ref 38–126)
Anion gap: 8 (ref 5–15)
BUN: 14 mg/dL (ref 6–20)
CHLORIDE: 103 mmol/L (ref 101–111)
CO2: 29 mmol/L (ref 22–32)
CREATININE: 0.89 mg/dL (ref 0.61–1.24)
Calcium: 8.5 mg/dL — ABNORMAL LOW (ref 8.9–10.3)
GFR calc Af Amer: >60 mL/min (ref >60)
Glucose, Bld: 136 mg/dL — ABNORMAL HIGH (ref 65–99)
Potassium: 3.1 mmol/L — ABNORMAL LOW (ref 3.5–5.1)
Sodium: 140 mmol/L (ref 135–145)
Total Bilirubin: 1.3 mg/dL — ABNORMAL HIGH (ref 0.3–1.2)
Total Protein: 7.6 g/dL (ref 6.5–8.1)

## 2017-03-02 LAB — URINALYSIS, MICROSCOPIC (REFLEX): Squamous Epithelial / LPF: NONE SEEN

## 2017-03-02 LAB — URINALYSIS, ROUTINE W REFLEX MICROSCOPIC
Bilirubin Urine: NEGATIVE
Glucose, UA: NEGATIVE mg/dL
Hgb urine dipstick: NEGATIVE
Ketones, ur: NEGATIVE mg/dL
LEUKOCYTES UA: NEGATIVE
Nitrite: NEGATIVE
PROTEIN: 30 mg/dL — AB
Specific Gravity, Urine: 1.024 (ref 1.005–1.030)
pH: 7.5 (ref 5.0–8.0)

## 2017-03-02 LAB — LIPASE, BLOOD: LIPASE: 20 U/L (ref 11–51)

## 2017-03-02 MED ORDER — MORPHINE SULFATE (PF) 4 MG/ML IV SOLN: 2.0000 mg | INTRAVENOUS | Status: DC | PRN

## 2017-03-02 MED ORDER — METOPROLOL SUCCINATE ER 50 MG PO TB24
100.0000 mg | ORAL_TABLET | Freq: Every day | ORAL | Status: DC
  Administered 2017-03-03 – 2017-03-05 (×3): 100 mg via ORAL
  Filled 2017-03-02 (×4): qty 2

## 2017-03-02 MED ORDER — CHLORHEXIDINE GLUCONATE 0.12 % MT SOLN
15.0000 mL | Freq: Two times a day (BID) | OROMUCOSAL | Status: DC
  Administered 2017-03-02 – 2017-03-05 (×5): 15 mL via OROMUCOSAL
  Filled 2017-03-02 (×5): qty 15

## 2017-03-02 MED ORDER — POTASSIUM CHLORIDE CRYS ER 20 MEQ PO TBCR
40.0000 meq | EXTENDED_RELEASE_TABLET | Freq: Once | ORAL | Status: AC
  Administered 2017-03-02: 40 meq via ORAL
  Filled 2017-03-02: qty 2

## 2017-03-02 MED ORDER — IBUPROFEN 200 MG PO TABS
400.0000 mg | ORAL_TABLET | ORAL | Status: DC | PRN
  Administered 2017-03-03: 400 mg via ORAL
  Filled 2017-03-02: qty 2

## 2017-03-02 MED ORDER — LEVOTHYROXINE SODIUM 75 MCG PO TABS
75.0000 ug | ORAL_TABLET | Freq: Every day | ORAL | Status: DC
  Administered 2017-03-03 – 2017-03-05 (×3): 75 ug via ORAL
  Filled 2017-03-02 (×3): qty 1

## 2017-03-02 MED ORDER — IOPAMIDOL (ISOVUE-300) INJECTION 61%
100.0000 mL | Freq: Once | INTRAVENOUS | Status: AC | PRN
  Administered 2017-03-02: 100 mL via INTRAVENOUS

## 2017-03-02 MED ORDER — SODIUM CHLORIDE 0.9 % IV SOLN
Freq: Once | INTRAVENOUS | Status: AC
  Administered 2017-03-02: 999 mL via INTRAVENOUS

## 2017-03-02 MED ORDER — ONDANSETRON HCL 4 MG/2ML IJ SOLN: 4.0000 mg | Freq: Four times a day (QID) | INTRAMUSCULAR | Status: DC | PRN

## 2017-03-02 MED ORDER — ATORVASTATIN CALCIUM 20 MG PO TABS
20.0000 mg | ORAL_TABLET | Freq: Every day | ORAL | Status: DC
  Administered 2017-03-03 – 2017-03-05 (×3): 20 mg via ORAL
  Filled 2017-03-02 (×4): qty 1

## 2017-03-02 MED ORDER — ONDANSETRON HCL 4 MG/2ML IJ SOLN
4.0000 mg | Freq: Once | INTRAMUSCULAR | Status: AC
  Administered 2017-03-02: 4 mg via INTRAVENOUS
  Filled 2017-03-02: qty 2

## 2017-03-02 MED ORDER — PIPERACILLIN-TAZOBACTAM 3.375 G IVPB 30 MIN
3.3750 g | Freq: Once | INTRAVENOUS | Status: AC
  Administered 2017-03-02: 3.375 g via INTRAVENOUS
  Filled 2017-03-02 (×2): qty 50

## 2017-03-02 MED ORDER — SODIUM CHLORIDE 0.9 % IV BOLUS (SEPSIS)
1000.0000 mL | Freq: Once | INTRAVENOUS | Status: AC
  Administered 2017-03-02: 1000 mL via INTRAVENOUS

## 2017-03-02 MED ORDER — HYDROMORPHONE HCL 1 MG/ML IJ SOLN
1.0000 mg | INTRAMUSCULAR | Status: AC | PRN
  Administered 2017-03-02 (×2): 1 mg via INTRAVENOUS
  Filled 2017-03-02 (×2): qty 1

## 2017-03-02 MED ORDER — HYDROMORPHONE HCL 1 MG/ML IJ SOLN
1.5000 mg | Freq: Once | INTRAMUSCULAR | Status: AC
  Administered 2017-03-02: 0.5 mg via INTRAVENOUS

## 2017-03-02 MED ORDER — FENTANYL CITRATE (PF) 100 MCG/2ML IJ SOLN
100.0000 ug | Freq: Once | INTRAMUSCULAR | Status: AC
  Administered 2017-03-02: 100 ug via INTRAVENOUS
  Filled 2017-03-02: qty 2

## 2017-03-02 MED ORDER — ZOLPIDEM TARTRATE 5 MG PO TABS
5.0000 mg | ORAL_TABLET | Freq: Every evening | ORAL | Status: DC | PRN
Start: 2017-03-02 — End: 2017-03-05
  Administered 2017-03-02 – 2017-03-04 (×5): 5 mg via ORAL
  Filled 2017-03-02 (×5): qty 1

## 2017-03-02 MED ORDER — SODIUM CHLORIDE 0.9 % IV BOLUS (SEPSIS)
2000.0000 mL | Freq: Once | INTRAVENOUS | Status: AC
  Administered 2017-03-02: 2000 mL via INTRAVENOUS

## 2017-03-02 MED ORDER — AMLODIPINE BESYLATE 5 MG PO TABS
5.0000 mg | ORAL_TABLET | Freq: Every day | ORAL | Status: DC
  Administered 2017-03-03 – 2017-03-05 (×3): 5 mg via ORAL
  Filled 2017-03-02 (×4): qty 1

## 2017-03-02 MED ORDER — ORAL CARE MOUTH RINSE
15.0000 mL | Freq: Two times a day (BID) | OROMUCOSAL | Status: DC
  Administered 2017-03-03 – 2017-03-05 (×2): 15 mL via OROMUCOSAL

## 2017-03-02 MED ORDER — HYDROMORPHONE HCL 1 MG/ML IJ SOLN
1.0000 mg | INTRAMUSCULAR | Status: DC | PRN
  Administered 2017-03-02: 1 mg via INTRAVENOUS
  Filled 2017-03-02 (×2): qty 1

## 2017-03-02 MED ORDER — PIPERACILLIN-TAZOBACTAM 3.375 G IVPB
3.3750 g | Freq: Three times a day (TID) | INTRAVENOUS | Status: DC
  Administered 2017-03-02 – 2017-03-05 (×8): 3.375 g via INTRAVENOUS
  Filled 2017-03-02 (×10): qty 50

## 2017-03-02 MED ORDER — ACETAMINOPHEN 325 MG PO TABS
650.0000 mg | ORAL_TABLET | Freq: Three times a day (TID) | ORAL | Status: DC
  Administered 2017-03-02 – 2017-03-05 (×9): 650 mg via ORAL
  Filled 2017-03-02 (×10): qty 2

## 2017-03-02 MED ORDER — ENOXAPARIN SODIUM 60 MG/0.6ML ~~LOC~~ SOLN
0.5000 mg/kg | SUBCUTANEOUS | Status: DC
  Administered 2017-03-02 – 2017-03-04 (×3): 60 mg via SUBCUTANEOUS
  Filled 2017-03-02 (×5): qty 0.6

## 2017-03-02 MED ORDER — HYDROMORPHONE HCL 1 MG/ML IJ SOLN
1.0000 mg | INTRAMUSCULAR | Status: DC | PRN
  Administered 2017-03-02 – 2017-03-04 (×9): 1 mg via INTRAVENOUS
  Filled 2017-03-02 (×11): qty 1

## 2017-03-02 MED ORDER — POTASSIUM CHLORIDE IN NACL 20-0.9 MEQ/L-% IV SOLN
Freq: Once | INTRAVENOUS | Status: AC
  Administered 2017-03-02: 15:00:00 via INTRAVENOUS
  Filled 2017-03-02: qty 1000

## 2017-03-02 MED ORDER — KCL IN DEXTROSE-NACL 20-5-0.45 MEQ/L-%-% IV SOLN
INTRAVENOUS | Status: DC
  Administered 2017-03-02: 18:00:00 via INTRAVENOUS
  Administered 2017-03-04 – 2017-03-05 (×3): 1000 mL via INTRAVENOUS
  Administered 2017-03-05: 13:00:00 via INTRAVENOUS
  Filled 2017-03-02 (×7): qty 1000

## 2017-03-02 NOTE — ED Notes (Signed)
Patient transported to CT 

## 2017-03-02 NOTE — Progress Notes (Signed)
Pharmacy Antibiotic Note  Michael Liu is a 46 y.o. male admitted on 03/02/2017 with intra-abdominal infection.  Pharmacy has been consulted for Zosyn dosing.  Plan: Start Zosyn 3.375g IV Q8H infused over 4hrs. SCr is wnl, CrCl >100, dose-adjustments are not likely to be needed so pharmacy will sign-off.    Height: 5\' 9"  (175.3 cm) Weight: 260 lb (117.9 kg) IBW/kg (Calculated) : 70.7  Temp (24hrs), Avg:98.4 F (36.9 C), Min:97.7 F (36.5 C), Max:99.3 F (37.4 C)   Recent Labs Lab 03/02/17 1138  WBC 13.1*  CREATININE 0.89    Estimated Creatinine Clearance: 130 mL/min (by C-G formula based on SCr of 0.89 mg/dL).    Allergies  Allergen Reactions  . Percocet [Oxycodone-Acetaminophen] Other (See Comments)    Made him feel funny    Thank you for allowing pharmacy to be a part of this patient's care.  Romeo Rabon, PharmD, pager [phone]. 03/02/2017,5:14 PM.

## 2017-03-02 NOTE — ED Notes (Signed)
Took Zofran PTA and flomax last night at 2300

## 2017-03-02 NOTE — H&P (Signed)
History and Physical    Michael Liu SEG:[id number] DOB: Feb 17, 1971 DOA: 03/02/2017  Referring MD/NP/PA: Sherwood Gambler [facility]) PCP: Marton Redwood, MD  Patient coming from: home  Chief Complaint: abdominal pain  HPI: Michael Liu is a 46 y.o. male with medical history significant of hypertension, hyperlipidemia, and kidney stones who presented to [facility] with abdominal pain.  Pain started the day prior to admission as a shooting 3/10 pain in the LLQ.  Pain came and went and improved some with ibuprofen overnight.  Pain suddenly worsened today, increasing to a 10/10 and spreading to the bilateral lower quadrants.  Pain today was associated with nausea without vomiting, chills, and cold sweats.  He denies fevers, vomiting.  He had a normal bowel movement last night.  He has had increased urgency of urination but sometimes nothing comes out.  At the end of urination, he has worsening abdominal pain.  Movement also worsens his pain.  Dilaudid in the ER has improved his pain.    ED Course: In the ER, vital signs were notable only for mild hypertension.  Labs were concerning for WBC 13.1 and mild hypokalemia.  CT scan demonstrated extensive diverticulitis of the descending and sigmoid colon with microperforation and without abscess.  UA negative.    Given IVF, fentanyl, dilaudid, potassium, and IV zosyn, first dose at 1425 on 3/10.    Review of Systems:  General:  Denies fevers, positive chills, denies weight loss or gain HEENT:  Denies changes to hearing and vision, rhinorrhea, sinus congestion, sore throat CV:  Denies chest pain and palpitations, lower extremity edema.  PULM:  Denies SOB, wheezing, cough.   GI:  positive nausea, denies vomiting, constipation, diarrhea.   GU:  Denies dysuria, increased frequency and urgency ENDO:  Denies polyuria, polydipsia.   HEME:  Denies hematemesis, blood in stools, melena, abnormal bruising or bleeding.  LYMPH:  Denies lymphadenopathy.   MSK:  Denies  arthralgias, myalgias.   DERM:  Denies skin rash or ulcer.   NEURO:  Denies focal numbness, weakness, slurred speech, confusion, facial droop.  PSYCH:  Denies anxiety and depression.    Past Medical History:  Diagnosis Date  . High cholesterol   . Hypertension   . Hypothyroidism   . Obesity (BMI 30-39.9)     History reviewed. No pertinent surgical history.   reports that he has never smoked. He has never used smokeless tobacco. He reports that he does not drink alcohol or use drugs.  Allergies  Allergen Reactions  . Percocet [Oxycodone-Acetaminophen] Other (See Comments)    Made him feel funny    Family History  Problem Relation Age of Onset  . Basal cell carcinoma Mother   . Hypertension Father   . Hypertension Sister   . Diabetes Sister   . Hypertension Brother   . Cancer Neg Hx   . Colon cancer Neg Hx     Prior to Admission medications   Medication Sig Start Date End Date Taking? Authorizing Provider  amLODipine (NORVASC) 5 MG tablet Take 5 mg by mouth daily. 07/01/16   Historical Provider, MD  atorvastatin (LIPITOR) 20 MG tablet Take 20 mg by mouth every morning. 08/02/16   Historical Provider, MD  ciprofloxacin (CIPRO) 500 MG tablet Take 1 tablet (500 mg total) by mouth 2 (two) times daily. 08/13/16   Varney Biles, MD  HYDROcodone-acetaminophen (NORCO/VICODIN) 5-325 MG tablet Take 1 tablet by mouth every 6 (six) hours as needed. 08/13/16   Varney Biles, MD  levothyroxine (SYNTHROID, LEVOTHROID)  75 MCG tablet Take 75 mcg by mouth daily before breakfast.    Historical Provider, MD  metoprolol succinate (TOPROL-XL) 100 MG 24 hr tablet Take 100 mg by mouth daily. 06/19/16   Historical Provider, MD  metroNIDAZOLE (FLAGYL) 500 MG tablet Take 1 tablet (500 mg total) by mouth 2 (two) times daily. 08/13/16   Varney Biles, MD  ondansetron (ZOFRAN ODT) 8 MG disintegrating tablet Take 1 tablet (8 mg total) by mouth every 8 (eight) hours as needed for nausea. 08/13/16   Varney Biles, MD  potassium chloride SA (K-DUR,KLOR-CON) 20 MEQ tablet Take 20 mEq by mouth every morning.     Historical Provider, MD  zolpidem (AMBIEN CR) 12.5 MG CR tablet Take 12.5 mg by mouth at bedtime.     Historical Provider, MD    Physical Exam: Vitals:   03/02/17 1401 03/02/17 1401 03/02/17 1531 03/02/17 1636  BP: 134/67 134/67 132/71 (!) 142/80  Pulse: 85 85 88 82  Resp:  16 16 18   Temp:   98.1 F (36.7 C) 99.3 F (37.4 C)  TempSrc:   Oral Oral  SpO2: 92% 94% 94% 97%  Weight:      Height:        Constitutional: Obese male, NAD, calm, comfortable Eyes: PERRL, lids and conjunctivae normal ENMT:  Dry MM. Posterior pharynx clear of any exudate or lesions.  Normal dentition.  Neck: normal, supple, no masses, no thyromegaly Respiratory: clear to auscultation bilaterally, no wheezing, no crackles. Normal respiratory effort. No accessory muscle use.  Cardiovascular: Regular rate and rhythm, no murmurs / rubs.  Possible S4 gallops. No extremity edema. 2+ pedal pulses. Abdomen:  Hypoactive BS, nondistended.  TTP predominantly in the LUQ, LLQ.  Guarding in the LUQ with some referred pain.   Musculoskeletal: no clubbing / cyanosis. No joint deformity upper and lower extremities. Good ROM, no contractures. Normal muscle tone.  Skin: no rashes, lesions, ulcers. No induration Neurologic: CN 2-12 grossly intact. Sensation intact, DTR normal. Strength 5/5 in all 4.  Psychiatric: Normal judgment and insight. Alert and oriented x 3. Normal mood.   Labs on Admission: I have personally reviewed following labs and imaging studies  CBC:  Recent Labs Lab 03/02/17 1138  WBC 13.1*  NEUTROABS 11.1*  HGB 14.1  HCT 40.9  MCV 85.0  PLT 712   Basic Metabolic Panel:  Recent Labs Lab 03/02/17 1138  NA 140  K 3.1*  CL 103  CO2 29  GLUCOSE 136*  BUN 14  CREATININE 0.89  CALCIUM 8.5*   GFR: Estimated Creatinine Clearance: 130 mL/min (by C-G formula based on SCr of 0.89 mg/dL). Liver  Function Tests:  Recent Labs Lab 03/02/17 1138  AST 22  ALT 25  ALKPHOS 110  BILITOT 1.3*  PROT 7.6  ALBUMIN 4.1    Recent Labs Lab 03/02/17 1138  LIPASE 20   No results for input(s): AMMONIA in the last 168 hours. Coagulation Profile: No results for input(s): INR, PROTIME in the last 168 hours. Cardiac Enzymes: No results for input(s): CKTOTAL, CKMB, CKMBINDEX, TROPONINI in the last 168 hours. BNP (last 3 results) No results for input(s): PROBNP in the last 8760 hours. HbA1C: No results for input(s): HGBA1C in the last 72 hours. CBG: No results for input(s): GLUCAP in the last 168 hours. Lipid Profile: No results for input(s): CHOL, HDL, LDLCALC, TRIG, CHOLHDL, LDLDIRECT in the last 72 hours. Thyroid Function Tests: No results for input(s): TSH, T4TOTAL, FREET4, T3FREE, THYROIDAB in the last 72 hours.  Anemia Panel: No results for input(s): VITAMINB12, FOLATE, FERRITIN, TIBC, IRON, RETICCTPCT in the last 72 hours. Urine analysis:    Component Value Date/Time   COLORURINE AMBER (A) 03/02/2017 1130   APPEARANCEUR CLEAR 03/02/2017 1130   LABSPEC 1.024 03/02/2017 1130   PHURINE 7.5 03/02/2017 1130   GLUCOSEU NEGATIVE 03/02/2017 1130   HGBUR NEGATIVE 03/02/2017 1130   BILIRUBINUR NEGATIVE 03/02/2017 1130   KETONESUR NEGATIVE 03/02/2017 1130   PROTEINUR 30 (A) 03/02/2017 1130   NITRITE NEGATIVE 03/02/2017 1130   LEUKOCYTESUR NEGATIVE 03/02/2017 1130   Sepsis Labs: @LABRCNTIP (procalcitonin:4,lacticidven:4) )No results found for this or any previous visit (from the past 240 hour(s)).   Radiological Exams on Admission: Ct Abdomen Pelvis W Contrast  Result Date: 03/02/2017 CLINICAL DATA:  Pt with diffuse abdominal pain since yesterday, with nausea, pain severe in nature- worse with movement, elevated WBC EXAM: CT ABDOMEN AND PELVIS WITH CONTRAST TECHNIQUE: Multidetector CT imaging of the abdomen and pelvis was performed using the standard protocol following bolus  administration of intravenous contrast. CONTRAST:  145mL ISOVUE-300 IOPAMIDOL (ISOVUE-300) INJECTION 61% COMPARISON:  08/13/2016; 05/17/2007 FINDINGS: Lower chest: Limited visualization of the lower thorax is negative for focal airspace opacity or pleural effusion. Minimal subsegmental atelectasis within the imaged bilateral lung bases. No focal airspace opacities. No pleural effusion. Normal heart size.  No pericardial effusion. Hepatobiliary: In need normal hepatic contour. No discrete hepatic lesions. Normal appearance of the gallbladder given degree distention. No radiopaque gallstones. No intra extrahepatic biliary duct dilatation. No ascites. Pancreas: Normal appearance of the pancreas Spleen: Normal appearance of the spleen Adrenals/Urinary Tract: There is symmetric enhancement and excretion of the bilateral kidneys. Renal cysts are seen bilaterally with dominant exophytic cyst arising from the inferior pole the left kidney measuring approximately 5.4 cm in diameter. Additional bilateral subcentimeter hypoattenuating renal lesions are too small to adequately characterize of favored to represent additional renal cysts. No evidence of nephrolithiasis on this postcontrast examination. No urinary obstruction. There is a minimal amount of grossly symmetric bilateral perinephric stranding, presumably body habitus related. There is mild thickening involving the medial limb of the right adrenal gland without discrete nodule. Normal appearance of the left adrenal gland. Normal appearance of the urinary bladder given degree distention. Stomach/Bowel: There is rather extensive colonic diverticulosis involving the distal aspect of the descending colon as well as the majority of the sigmoid colon with ill-defined stranding about the sigmoid colon located within the left lower abdominal quadrant/pelvis (representative image 69, series 2. This finding is associated with tiny amount of adjacent foci of pneumoperitoneum  (representative images 73 and 75, series 2) as well as small amount of pneumoperitoneum within the nondependent portion of the upper abdomen (images 19, 21 and 25, series 2). No definable/drainable fluid collection. Moderate colonic stool burden without evidence of enteric obstruction. Note is made of a punctate (approximately 0.9 cm) intraluminal lipoma within the horizontal segment of the duodenum (image 46, series 2), without evidence of intussusception. Normal appearance of the terminal ileum and appendix. No pneumatosis or portal venous gas. Vascular/Lymphatic: Normal caliber the abdominal aorta. The major branch vessels of the abdominal aorta appear patent on this non CTA examination. No bulky retroperitoneal, mesenteric, pelvic or inguinal lymphadenopathy. Reproductive: Normal appearance of the pelvic organs. No free fluid in the pelvic cul-de-sac. Other: Small mesenteric fat containing left-sided inguinal hernia. Tiny mesenteric fat containing periumbilical hernia. Musculoskeletal: No acute or aggressive osseous abnormalities. Straightening and slight reversal the expected lumbar lordosis. 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 Bone island is noted within the left femoral neck. IMPRESSION: 1. The examination is positive for acute diverticulitis involving the sigmoid colon of the left lower abdomen/pelvis with associated microperforation and minimal amount of pneumoperitoneum. There is a minimal amount of adjacent ill-defined mesenteric stranding without evidence of definable/drainable fluid collection. 2. Bilateral renal cysts. Critical Value/emergent results were called by telephone at the time of interpretation on 03/02/2017 at 2:10 pm to Dr. Sherwood Gambler , who verbally acknowledged these results. Electronically Signed   By: Sandi Mariscal M.D.   On: 03/02/2017 14:12    EKG: Independently reviewed. NRS.  88bpm.  QTc 460ms wnl  Assessment/Plan Principal Problem:   Diverticulitis of  colon with perforation Active Problems:   Obesity (BMI 30-39.9)   Hypothyroidism   Hypertension   High cholesterol   Hypokalemia   Colonic diverticulosis involving the descending colon and majority of the sigmoid colon with adjacent pneumoperitoneum as well as some pneumoperitoneum in the nondependent upper abdomen.  Stranding but no drainable abscess -  Moderate stool burden noted -  NPO with IVF -  Schedule tylenol -  Dilaudid for breakthrough pain tonight -  Continue zosyn -  General surgery consult pending  Bilateral renal cysts with a dominant exophytic cyst arising from the inferior pole of the left kidney 5.4 cm in diameter, likely has polycystic kidney disease.  Currently no evidence of CKD.    Essential hypertension, blood pressures mildly elevated secondary to pain  -  Control pain -  Continue norvasc, metoprolol  Hyperlipidemia, stable, continue atorvastatin  Hypothyroidism, stable, continue synthroid  Hypokalemia -  Oral and IV potassium chloride  Obesity, BMI 30-39.  Currently NPO.  Defer weight loss strategy to PCP.  DVT prophylaxis: lovenox  Code Status: full code Family Communication: patient alone  Disposition Plan:  home  Consults called: General Surgery, Dr. Marlou Starks III consulted  Admission status:  Inpatient   Janece Canterbury MD [facility] Pager [phone]  If 7PM-7AM, please contact night-coverage [email or web address] Password [facility]  03/02/2017, 5:38 PM

## 2017-03-02 NOTE — ED Triage Notes (Signed)
Pt states he has lower abd pain and groin pain, feels like his last kidney stone

## 2017-03-02 NOTE — ED Provider Notes (Signed)
[facility] Provider Note   CSN: [id number] Arrival date & time: 03/02/17  1123     History   Chief Complaint Chief Complaint  Patient presents with  . Abdominal Pain    HPI Dymond Spreen is a 46 y.o. male.  HPI  46 year old male with a past history of diverticulitis and a left ureteral stone in August 2017 presents with abdominal pain since last night. He got off the plane around 10 PM and started having abdominal pain. History is diffuse lower abdomen. Started in his testicles but now those no longer hurt. Had some difficulty urinating but no dysuria or hematuria. There is no back pain. He has not had fevers but has had significant nausea. Has taken ibuprofen and Zofran with minimal relief. At this time his abdominal pain is severe.  Past Medical History:  Diagnosis Date  . High cholesterol   . Hypertension     Patient Active Problem List   Diagnosis Date Noted  . Diverticulitis of large intestine with perforation 03/02/2017  . BMI 50.0-59.9, adult ([place]) 07/19/2013    History reviewed. No pertinent surgical history.     Home Medications    Prior to Admission medications   Medication Sig Start Date End Date Taking? Authorizing Provider  amLODipine (NORVASC) 5 MG tablet Take 5 mg by mouth daily. 07/01/16   Historical Provider, MD  atorvastatin (LIPITOR) 20 MG tablet Take 20 mg by mouth every morning. 08/02/16   Historical Provider, MD  ciprofloxacin (CIPRO) 500 MG tablet Take 1 tablet (500 mg total) by mouth 2 (two) times daily. 08/13/16   Varney Biles, MD  HYDROcodone-acetaminophen (NORCO/VICODIN) 5-325 MG tablet Take 1 tablet by mouth every 6 (six) hours as needed. 08/13/16   Varney Biles, MD  levothyroxine (SYNTHROID, LEVOTHROID) 75 MCG tablet Take 75 mcg by mouth daily before breakfast.    Historical Provider, MD  metoprolol succinate (TOPROL-XL) 100 MG 24 hr tablet Take 100 mg by mouth daily. 06/19/16   Historical Provider, MD  metroNIDAZOLE (FLAGYL)  500 MG tablet Take 1 tablet (500 mg total) by mouth 2 (two) times daily. 08/13/16   Varney Biles, MD  ondansetron (ZOFRAN ODT) 8 MG disintegrating tablet Take 1 tablet (8 mg total) by mouth every 8 (eight) hours as needed for nausea. 08/13/16   Varney Biles, MD  potassium chloride SA (K-DUR,KLOR-CON) 20 MEQ tablet Take 20 mEq by mouth every morning.     Historical Provider, MD  zolpidem (AMBIEN CR) 12.5 MG CR tablet Take 12.5 mg by mouth at bedtime.     Historical Provider, MD    Family History Family History  Problem Relation Age of Onset  . Hypertension Father     Social History Social History  Substance Use Topics  . Smoking status: Never Smoker  . Smokeless tobacco: Never Used  . Alcohol use No     Allergies   Percocet [oxycodone-acetaminophen]   Review of Systems Review of Systems  Constitutional: Positive for diaphoresis. Negative for fever.  Gastrointestinal: Positive for abdominal pain and nausea. Negative for vomiting.  Genitourinary: Positive for difficulty urinating. Negative for dysuria and hematuria.  Musculoskeletal: Negative for back pain.  All other systems reviewed and are negative.    Physical Exam Updated Vital Signs BP 132/71 (BP Location: Right Arm)   Pulse 88   Temp 98.1 F (36.7 C) (Oral)   Resp 16   Ht 5\' 9"  (1.753 m)   Wt 260 lb (117.9 kg)   SpO2 94%  BMI 38.40 kg/m   Physical Exam  Constitutional: He is oriented to person, place, and time. He appears well-developed and well-nourished. He appears distressed (in pain).  HENT:  Head: Normocephalic and atraumatic.  Right Ear: External ear normal.  Left Ear: External ear normal.  Nose: Nose normal.  Eyes: Right eye exhibits no discharge. Left eye exhibits no discharge.  Neck: Neck supple.  Cardiovascular: Normal rate, regular rhythm and normal heart sounds.   Pulmonary/Chest: Effort normal and breath sounds normal.  Abdominal: Soft. There is tenderness (diffuse). There is guarding. No  hernia (no obvious hernia).  Genitourinary: Testes normal and penis normal. Right testis shows no swelling and no tenderness. Left testis shows no swelling and no tenderness.  Musculoskeletal: He exhibits no edema.  Neurological: He is alert and oriented to person, place, and time.  Skin: Skin is warm and dry. He is not diaphoretic.  Nursing note and vitals reviewed.    ED Treatments / Results  Labs (all labs ordered are listed, but only abnormal results are displayed) Labs Reviewed  URINALYSIS, ROUTINE W REFLEX MICROSCOPIC - Abnormal; Notable for the following:       Result Value   Color, Urine AMBER (*)    Protein, ur 30 (*)    All other components within normal limits  CBC WITH DIFFERENTIAL/PLATELET - Abnormal; Notable for the following:    WBC 13.1 (*)    Neutro Abs 11.1 (*)    All other components within normal limits  COMPREHENSIVE METABOLIC PANEL - Abnormal; Notable for the following:    Potassium 3.1 (*)    Glucose, Bld 136 (*)    Calcium 8.5 (*)    Total Bilirubin 1.3 (*)    All other components within normal limits  URINALYSIS, MICROSCOPIC (REFLEX) - Abnormal; Notable for the following:    Bacteria, UA FEW (*)    All other components within normal limits  LIPASE, BLOOD    EKG  EKG Interpretation None       Radiology Ct Abdomen Pelvis W Contrast  Result Date: 03/02/2017 CLINICAL DATA:  Pt with diffuse abdominal pain since yesterday, with nausea, pain severe in nature- worse with movement, elevated WBC EXAM: CT ABDOMEN AND PELVIS WITH CONTRAST TECHNIQUE: Multidetector CT imaging of the abdomen and pelvis was performed using the standard protocol following bolus administration of intravenous contrast. CONTRAST:  19mL ISOVUE-300 IOPAMIDOL (ISOVUE-300) INJECTION 61% COMPARISON:  08/13/2016; 05/17/2007 FINDINGS: Lower chest: Limited visualization of the lower thorax is negative for focal airspace opacity or pleural effusion. Minimal subsegmental atelectasis within the  imaged bilateral lung bases. No focal airspace opacities. No pleural effusion. Normal heart size.  No pericardial effusion. Hepatobiliary: In need normal hepatic contour. No discrete hepatic lesions. Normal appearance of the gallbladder given degree distention. No radiopaque gallstones. No intra extrahepatic biliary duct dilatation. No ascites. Pancreas: Normal appearance of the pancreas Spleen: Normal appearance of the spleen Adrenals/Urinary Tract: There is symmetric enhancement and excretion of the bilateral kidneys. Renal cysts are seen bilaterally with dominant exophytic cyst arising from the inferior pole the left kidney measuring approximately 5.4 cm in diameter. Additional bilateral subcentimeter hypoattenuating renal lesions are too small to adequately characterize of favored to represent additional renal cysts. No evidence of nephrolithiasis on this postcontrast examination. No urinary obstruction. There is a minimal amount of grossly symmetric bilateral perinephric stranding, presumably body habitus related. There is mild thickening involving the medial limb of the right adrenal gland without discrete nodule. Normal appearance of the left adrenal gland.  Normal appearance of the urinary bladder given degree distention. Stomach/Bowel: There is rather extensive colonic diverticulosis involving the distal aspect of the descending colon as well as the majority of the sigmoid colon with ill-defined stranding about the sigmoid colon located within the left lower abdominal quadrant/pelvis (representative image 69, series 2. This finding is associated with tiny amount of adjacent foci of pneumoperitoneum (representative images 73 and 75, series 2) as well as small amount of pneumoperitoneum within the nondependent portion of the upper abdomen (images 19, 21 and 25, series 2). No definable/drainable fluid collection. Moderate colonic stool burden without evidence of enteric obstruction. Note is made of a punctate  (approximately 0.9 cm) intraluminal lipoma within the horizontal segment of the duodenum (image 46, series 2), without evidence of intussusception. Normal appearance of the terminal ileum and appendix. No pneumatosis or portal venous gas. Vascular/Lymphatic: Normal caliber the abdominal aorta. The major branch vessels of the abdominal aorta appear patent on this non CTA examination. No bulky retroperitoneal, mesenteric, pelvic or inguinal lymphadenopathy. Reproductive: Normal appearance of the pelvic organs. No free fluid in the pelvic cul-de-sac. Other: Small mesenteric fat containing left-sided inguinal hernia. Tiny mesenteric fat containing periumbilical hernia. Musculoskeletal: No acute or aggressive osseous abnormalities. Straightening and slight reversal the expected lumbar lordosis. 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 Bone island is noted within the left femoral neck. IMPRESSION: 1. The examination is positive for acute diverticulitis involving the sigmoid colon of the left lower abdomen/pelvis with associated microperforation and minimal amount of pneumoperitoneum. There is a minimal amount of adjacent ill-defined mesenteric stranding without evidence of definable/drainable fluid collection. 2. Bilateral renal cysts. Critical Value/emergent results were called by telephone at the time of interpretation on 03/02/2017 at 2:10 pm to Dr. Sherwood Gambler , who verbally acknowledged these results. Electronically Signed   By: Sandi Mariscal M.D.   On: 03/02/2017 14:12    Procedures Procedures (including critical care time)  Medications Ordered in ED Medications  HYDROmorphone (DILAUDID) injection 1 mg (not administered)  HYDROmorphone (DILAUDID) injection 1.5 mg (not administered)  0.9 %  sodium chloride infusion ( Intravenous Stopped 03/02/17 1235)  ondansetron (ZOFRAN) injection 4 mg (4 mg Intravenous Given 03/02/17 1158)  fentaNYL (SUBLIMAZE) injection 100 mcg (100 mcg Intravenous  Given 03/02/17 1158)  sodium chloride 0.9 % bolus 1,000 mL (0 mLs Intravenous Stopped 03/02/17 1425)  HYDROmorphone (DILAUDID) injection 1 mg (1 mg Intravenous Given 03/02/17 1419)  iopamidol (ISOVUE-300) 61 % injection 100 mL (100 mLs Intravenous Contrast Given 03/02/17 1327)  piperacillin-tazobactam (ZOSYN) IVPB 3.375 g (0 g Intravenous Stopped 03/02/17 1505)  sodium chloride 0.9 % bolus 1,000 mL (1,000 mLs Intravenous Transfusing/Transfer 03/02/17 1532)  potassium chloride SA (K-DUR,KLOR-CON) CR tablet 40 mEq (40 mEq Oral Given 03/02/17 1514)  0.9 % NaCl with KCl 20 mEq/ L  infusion ( Intravenous Transfusing/Transfer 03/02/17 1532)     Initial Impression / Assessment and Plan / ED Course  I have reviewed the triage vital signs and the nursing notes.  Pertinent labs & imaging results that were available during my care of the patient were reviewed by me and considered in my medical decision making (see chart for details).  Clinical Course as of Mar 02 1541  Sat Mar 02, 2017  1211 Will get a CT scan given that his patient's pain is not classic for kidney stone. While he had testicle pain earlier there is no testicular tenderness or swelling. Having this as an abdominal process. He could be having a  spasm which could account for why his abdomen is tender but given the severe pain and tenderness, will get CT scan.  [SG]  1419 CT shows diverticulitis with perforation and developing abscess. Pain is better but he is still quite tender. Keep nothing by mouth, start IV Zosyn, and consult general surgery at [facility].  [SG]  1428 Dr Marlou Starks will see in consultation  [SG]  1447 Dr Algis Liming to admit. PO potassium, IV fluids with potassium. Transfer by [facility].  [SG]    Clinical Course User Index [SG] Sherwood Gambler, MD    Patient has remained stable while in the ED. IV Dilaudid has helped with the pain. Given IV Zosyn given the diverticulitis with perforation. He will be admitted to the hospitalist service  at [facility] with surgery consult. Transferred via [place].  Final Clinical Impressions(s) / ED Diagnoses   Final diagnoses:  Diverticulitis of large intestine with perforation without abscess or bleeding    New Prescriptions New Prescriptions   No medications on file     Sherwood Gambler, MD 03/02/17 1543

## 2017-03-02 NOTE — Consult Note (Signed)
Reason for Consult:abdominal pain Referring Physician: Dr. [name] is an 46 y.o. male.  HPI:  The patient is a 46 year old white male who began having abdominal pain yesterday after a business trip. The pain started down in his testicles and move it up to his lower abdomen. Pain worsened throughout the day so he came to the emergency department. He denies any fevers or chills. He denies any nausea or vomiting. A CT scan shows evidence of sigmoid diverticulitis with a couple small dots of extraluminal air but no abscess.  Past Medical History:  Diagnosis Date  . High cholesterol   . Hypertension   . Hypothyroidism   . Obesity (BMI 30-39.9)     History reviewed. No pertinent surgical history.  Family History  Problem Relation Age of Onset  . Basal cell carcinoma Mother   . Hypertension Father   . Hypertension Sister   . Diabetes Sister   . Hypertension Brother   . Cancer Neg Hx   . Colon cancer Neg Hx     Social History:  reports that he has never smoked. He has never used smokeless tobacco. He reports that he does not drink alcohol or use drugs.  Allergies:  Allergies  Allergen Reactions  . Percocet [Oxycodone-Acetaminophen] Other (See Comments)    Made him feel funny    Medications: I have reviewed the patient's current medications.  Results for orders placed or performed during the hospital encounter of 03/02/17 (from the past 48 hour(s))  Urinalysis, Routine w reflex microscopic     Status: Abnormal   Collection Time: 03/02/17 11:30 AM  Result Value Ref Range   Color, Urine AMBER (A) YELLOW    Comment: BIOCHEMICALS MAY BE AFFECTED BY COLOR   APPearance CLEAR CLEAR   Specific Gravity, Urine 1.024 1.005 - 1.030   pH 7.5 5.0 - 8.0   Glucose, UA NEGATIVE NEGATIVE mg/dL   Hgb urine dipstick NEGATIVE NEGATIVE   Bilirubin Urine NEGATIVE NEGATIVE   Ketones, ur NEGATIVE NEGATIVE mg/dL   Protein, ur 30 (A) NEGATIVE mg/dL   Nitrite NEGATIVE NEGATIVE    Leukocytes, UA NEGATIVE NEGATIVE  Urinalysis, Microscopic (reflex)     Status: Abnormal   Collection Time: 03/02/17 11:30 AM  Result Value Ref Range   RBC / HPF 0-5 0 - 5 RBC/hpf   WBC, UA 0-5 0 - 5 WBC/hpf   Bacteria, UA FEW (A) NONE SEEN   Squamous Epithelial / LPF NONE SEEN NONE SEEN   Mucous PRESENT   CBC with Differential     Status: Abnormal   Collection Time: 03/02/17 11:38 AM  Result Value Ref Range   WBC 13.1 (H) 4.0 - 10.5 K/uL   RBC 4.81 4.22 - 5.81 MIL/uL   Hemoglobin 14.1 13.0 - 17.0 g/dL   HCT 40.9 39.0 - 52.0 %   MCV 85.0 78.0 - 100.0 fL   MCH 29.3 26.0 - 34.0 pg   MCHC 34.5 30.0 - 36.0 g/dL   RDW 13.8 11.5 - 15.5 %   Platelets 179 150 - 400 K/uL   Neutrophils Relative % 85 %   Neutro Abs 11.1 (H) 1.7 - 7.7 K/uL   Lymphocytes Relative 10 %   Lymphs Abs 1.3 0.7 - 4.0 K/uL   Monocytes Relative 5 %   Monocytes Absolute 0.7 0.1 - 1.0 K/uL   Eosinophils Relative 0 %   Eosinophils Absolute 0.0 0.0 - 0.7 K/uL   Basophils Relative 0 %   Basophils Absolute 0.0 0.0 -  0.1 K/uL  Comprehensive metabolic panel     Status: Abnormal   Collection Time: 03/02/17 11:38 AM  Result Value Ref Range   Sodium 140 135 - 145 mmol/L   Potassium 3.1 (L) 3.5 - 5.1 mmol/L   Chloride 103 101 - 111 mmol/L   CO2 29 22 - 32 mmol/L   Glucose, Bld 136 (H) 65 - 99 mg/dL   BUN 14 6 - 20 mg/dL   Creatinine, Ser 0.89 0.61 - 1.24 mg/dL   Calcium 8.5 (L) 8.9 - 10.3 mg/dL   Total Protein 7.6 6.5 - 8.1 g/dL   Albumin 4.1 3.5 - 5.0 g/dL   AST 22 15 - 41 U/L   ALT 25 17 - 63 U/L   Alkaline Phosphatase 110 38 - 126 U/L   Total Bilirubin 1.3 (H) 0.3 - 1.2 mg/dL   GFR calc non Af Amer >60 >60 mL/min   GFR calc Af Amer >60 >60 mL/min    Comment: (NOTE) The eGFR has been calculated using the CKD EPI equation. This calculation has not been validated in all clinical situations. eGFR's persistently <60 mL/min signify possible Chronic Kidney Disease.    Anion gap 8 5 - 15  Lipase, blood     Status:  None   Collection Time: 03/02/17 11:38 AM  Result Value Ref Range   Lipase 20 11 - 51 U/L    Ct Abdomen Pelvis W Contrast  Result Date: 03/02/2017 CLINICAL DATA:  Pt with diffuse abdominal pain since yesterday, with nausea, pain severe in nature- worse with movement, elevated WBC EXAM: CT ABDOMEN AND PELVIS WITH CONTRAST TECHNIQUE: Multidetector CT imaging of the abdomen and pelvis was performed using the standard protocol following bolus administration of intravenous contrast. CONTRAST:  153m ISOVUE-300 IOPAMIDOL (ISOVUE-300) INJECTION 61% COMPARISON:  08/13/2016; 05/17/2007 FINDINGS: Lower chest: Limited visualization of the lower thorax is negative for focal airspace opacity or pleural effusion. Minimal subsegmental atelectasis within the imaged bilateral lung bases. No focal airspace opacities. No pleural effusion. Normal heart size.  No pericardial effusion. Hepatobiliary: In need normal hepatic contour. No discrete hepatic lesions. Normal appearance of the gallbladder given degree distention. No radiopaque gallstones. No intra extrahepatic biliary duct dilatation. No ascites. Pancreas: Normal appearance of the pancreas Spleen: Normal appearance of the spleen Adrenals/Urinary Tract: There is symmetric enhancement and excretion of the bilateral kidneys. Renal cysts are seen bilaterally with dominant exophytic cyst arising from the inferior pole the left kidney measuring approximately 5.4 cm in diameter. Additional bilateral subcentimeter hypoattenuating renal lesions are too small to adequately characterize of favored to represent additional renal cysts. No evidence of nephrolithiasis on this postcontrast examination. No urinary obstruction. There is a minimal amount of grossly symmetric bilateral perinephric stranding, presumably body habitus related. There is mild thickening involving the medial limb of the right adrenal gland without discrete nodule. Normal appearance of the left adrenal gland.  Normal appearance of the urinary bladder given degree distention. Stomach/Bowel: There is rather extensive colonic diverticulosis involving the distal aspect of the descending colon as well as the majority of the sigmoid colon with ill-defined stranding about the sigmoid colon located within the left lower abdominal quadrant/pelvis (representative image 69, series 2. This finding is associated with tiny amount of adjacent foci of pneumoperitoneum (representative images 73 and 75, series 2) as well as small amount of pneumoperitoneum within the nondependent portion of the upper abdomen (images 19, 21 and 25, series 2). No definable/drainable fluid collection. Moderate colonic stool burden without evidence of  enteric obstruction. Note is made of a punctate (approximately 0.9 cm) intraluminal lipoma within the horizontal segment of the duodenum (image 46, series 2), without evidence of intussusception. Normal appearance of the terminal ileum and appendix. No pneumatosis or portal venous gas. Vascular/Lymphatic: Normal caliber the abdominal aorta. The major branch vessels of the abdominal aorta appear patent on this non CTA examination. No bulky retroperitoneal, mesenteric, pelvic or inguinal lymphadenopathy. Reproductive: Normal appearance of the pelvic organs. No free fluid in the pelvic cul-de-sac. Other: Small mesenteric fat containing left-sided inguinal hernia. Tiny mesenteric fat containing periumbilical hernia. Musculoskeletal: No acute or aggressive osseous abnormalities. Straightening and slight reversal the expected lumbar lordosis. 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 Bone island is noted within the left femoral neck. IMPRESSION: 1. The examination is positive for acute diverticulitis involving the sigmoid colon of the left lower abdomen/pelvis with associated microperforation and minimal amount of pneumoperitoneum. There is a minimal amount of adjacent ill-defined mesenteric stranding  without evidence of definable/drainable fluid collection. 2. Bilateral renal cysts. Critical Value/emergent results were called by telephone at the time of interpretation on 03/02/2017 at 2:10 pm to Dr. Sherwood Gambler , who verbally acknowledged these results. Electronically Signed   By: Sandi Mariscal M.D.   On: 03/02/2017 14:12    Review of Systems  Constitutional: Negative.   HENT: Negative.   Eyes: Negative.   Respiratory: Negative.   Cardiovascular: Negative.   Gastrointestinal: Positive for abdominal pain.  Genitourinary: Negative.   Musculoskeletal: Negative.   Skin: Negative.   Neurological: Negative.   Endo/Heme/Allergies: Negative.   Psychiatric/Behavioral: Negative.    Blood pressure (!) 142/80, pulse 82, temperature 99.3 F (37.4 C), temperature source Oral, resp. rate 18, height _0  (1.753 m), weight 117.9 kg (260 lb), SpO2 97 %. Physical Exam  Constitutional: He is oriented to person, place, and time. He appears well-developed and well-nourished.  Obese white male  HENT:  Head: Normocephalic and atraumatic.  Eyes: Conjunctivae and EOM are normal. Pupils are equal, round, and reactive to light.  Neck: Normal range of motion. Neck supple.  Cardiovascular: Normal rate, regular rhythm and normal heart sounds.   Respiratory: Effort normal and breath sounds normal.  GI: Soft. Bowel sounds are normal.  There is moderate abdominal pain that is worse in the left lower quadrant. No obvious peritonitis.  Musculoskeletal: Normal range of motion.  Neurological: He is alert and oriented to person, place, and time.  Skin: Skin is warm and dry.  Psychiatric: He has a normal mood and affect. His behavior is normal.    Assessment/Plan:  The patient appears to have sigmoid diverticulitis with evidence of microperforation no abscess. At this point since he does not appear to be systemically ill I would recommend bowel rest and broad-spectrum antibiotic therapy. We will follow him closely  with you. If he improves over the next 24-48 hours then he may be able to avoid a more urgent surgery which would include a colostomy. I have discussed this all with him and he understands and agrees with this treatment plan.  TOTH III,Marites Nath S 03/02/2017, 7:25 PM

## 2017-03-03 DIAGNOSIS — K5720 Diverticulitis of large intestine with perforation and abscess without bleeding: Principal | ICD-10-CM

## 2017-03-03 LAB — CBC
HEMATOCRIT: 36.8 % — AB (ref 39.0–52.0)
HEMOGLOBIN: 12.3 g/dL — AB (ref 13.0–17.0)
MCH: 28.7 pg (ref 26.0–34.0)
MCHC: 33.4 g/dL (ref 30.0–36.0)
MCV: 86 fL (ref 78.0–100.0)
Platelets: 138 10*3/uL — ABNORMAL LOW (ref 150–400)
RBC: 4.28 MIL/uL (ref 4.22–5.81)
RDW: 14.2 % (ref 11.5–15.5)
WBC: 16.9 10*3/uL — ABNORMAL HIGH (ref 4.0–10.5)

## 2017-03-03 LAB — BASIC METABOLIC PANEL
ANION GAP: 6 (ref 5–15)
BUN: 12 mg/dL (ref 6–20)
CALCIUM: 7.9 mg/dL — AB (ref 8.9–10.3)
CO2: 26 mmol/L (ref 22–32)
Chloride: 107 mmol/L (ref 101–111)
Creatinine, Ser: 0.8 mg/dL (ref 0.61–1.24)
GFR calc Af Amer: >60 mL/min (ref >60)
GFR calc non Af Amer: >60 mL/min (ref >60)
GLUCOSE: 149 mg/dL — AB (ref 65–99)
POTASSIUM: 3.2 mmol/L — AB (ref 3.5–5.1)
Sodium: 139 mmol/L (ref 135–145)

## 2017-03-03 LAB — MAGNESIUM: Magnesium: 1.6 mg/dL — ABNORMAL LOW (ref 1.7–2.4)

## 2017-03-03 LAB — HIV ANTIBODY (ROUTINE TESTING W REFLEX): HIV Screen 4th Generation wRfx: NONREACTIVE

## 2017-03-03 MED ORDER — MAGNESIUM SULFATE 4 GM/100ML IV SOLN
4.0000 g | Freq: Once | INTRAVENOUS | Status: AC
  Administered 2017-03-03: 4 g via INTRAVENOUS
  Filled 2017-03-03: qty 100

## 2017-03-03 MED ORDER — IBUPROFEN 200 MG PO TABS: 400.0000 mg | ORAL_TABLET | Freq: Four times a day (QID) | ORAL | Status: DC | PRN

## 2017-03-03 MED ORDER — SODIUM CHLORIDE 0.9 % IV SOLN: 30.0000 meq | Freq: Once | INTRAVENOUS | Status: DC

## 2017-03-03 MED ORDER — POTASSIUM CHLORIDE 10 MEQ/100ML IV SOLN
10.0000 meq | INTRAVENOUS | Status: AC
  Administered 2017-03-03 (×3): 10 meq via INTRAVENOUS
  Filled 2017-03-03 (×3): qty 100

## 2017-03-03 MED ORDER — SODIUM CHLORIDE 0.9 % IV BOLUS (SEPSIS)
2000.0000 mL | Freq: Once | INTRAVENOUS | Status: AC
  Administered 2017-03-03: 2000 mL via INTRAVENOUS

## 2017-03-03 NOTE — Progress Notes (Signed)
PROGRESS NOTE  Michael Liu  YWV:[id number] DOB: 1971/11/08 DOA: 03/02/2017 PCP: Marton Redwood, MD  Brief Narrative:   Michael Liu is a 46 y.o. male with medical history significant of hypertension, hyperlipidemia, and kidney stones who presented to [facility] with a 1-day history of rapidly worsening abdominal pain.  CT scan demonstrated extensive diverticulitis of the descending and sigmoid colon with microperforation and without abscess.  General surgery consulting.  Patient feels clinically improved with IVF, antibiotics, and pain medication.    Assessment & Plan:   Principal Problem:   Diverticulitis of colon with perforation Active Problems:   Obesity (BMI 30-39.9)   Hypothyroidism   Hypertension   High cholesterol   Hypokalemia  Colonic diverticulosis involving the descending colon and majority of the sigmoid colon with adjacent pneumoperitoneum as well as some pneumoperitoneum in the nondependent upper abdomen.  Stranding but no drainable abscess -  Sips and ice chips today -  Continue IVF.  Still appears dry:  NS 2L bolus -  NPO with IVF -  Continue scheduled tylenol -  Continue prn ibuprofen -  Dilaudid for breakthrough pain -  Continue zosyn -  General surgery consult appreciated  Bilateral renal cysts with a dominant exophytic cyst arising from the inferior pole of the left kidney 5.4 cm in diameter, likely has polycystic kidney disease.  Currently no evidence of CKD.    Essential hypertension, blood pressures mildly elevated secondary to pain  -  Control pain -  Continue norvasc, metoprolol  Hyperlipidemia, stable, continue atorvastatin  Hypothyroidism, stable, continue synthroid  Hypokalemia -  IV potassium chloride -  Magnesium low also >> magnesium sulfate 4gm IV once  Obesity, BMI 30-39.  Currently NPO.  Defer weight loss strategy to PCP.  DVT prophylaxis: lovenox  Code Status: full code Family Communication: patient alone  Disposition Plan:   home   Consultants:   General surgery  Procedures:  none  Antimicrobials:  Anti-infectives    Start     Dose/Rate Route Frequency Ordered Stop   03/02/17 2000  piperacillin-tazobactam (ZOSYN) IVPB 3.375 g     3.375 g 12.5 mL/hr over 240 Minutes Intravenous Every 8 hours 03/02/17 1716     03/02/17 1415  piperacillin-tazobactam (ZOSYN) IVPB 3.375 g     3.375 g 100 mL/hr over 30 Minutes Intravenous  Once 03/02/17 1412 03/02/17 1505       Subjective: Abdominal pain improved with pain medication.  Had a sharp cramp along bilateral lower quadrants today.  Passing flatus but no BMs since admission.  Denies nausea and feels hungry and thirsty today.    Objective: Vitals:   03/02/17 2130 03/03/17 0507 03/03/17 0820 03/03/17 1401  BP: 122/79 113/76 120/80 137/76  Pulse: 83 72 80 72  Resp: 18 18  16   Temp: 98.7 F (37.1 C) 98.6 F (37 C)  99.1 F (37.3 C)  TempSrc: Oral Oral  Oral  SpO2: 95% 94%  96%  Weight:      Height:        Intake/Output Summary (Last 24 hours) at 03/03/17 1427 Last data filed at 03/03/17 1402  Gross per 24 hour  Intake          1714.59 ml  Output             1850 ml  Net          -135.41 ml   Filed Weights   03/02/17 1127  Weight: 117.9 kg (260 lb)    Examination:  General exam:  Adult male.  No acute distress.  HEENT:  NCAT, mildly dry MM Respiratory system: Clear to auscultation bilaterally Cardiovascular system: Regular rate and rhythm, normal S1/S2. No murmurs, rubs, gallops or clicks.  Warm extremities Gastrointestinal system: Hypoactive bowel sounds and somewhat higher pitched, mildly distended.  TTP just superior and to the left the umbilicus with decreased guarding compared to yesterday. MSK:  Normal tone and bulk, no lower extremity edema Neuro:  Grossly intact.  Seen ambulating in halls    Data Reviewed: I have personally reviewed following labs and imaging studies  CBC:  Recent Labs Lab 03/02/17 1138 03/03/17 0456  WBC  13.1* 16.9*  NEUTROABS 11.1*  --   HGB 14.1 12.3*  HCT 40.9 36.8*  MCV 85.0 86.0  PLT 179 932*   Basic Metabolic Panel:  Recent Labs Lab 03/02/17 1138 03/03/17 0456  NA 140 139  K 3.1* 3.2*  CL 103 107  CO2 29 26  GLUCOSE 136* 149*  BUN 14 12  CREATININE 0.89 0.80  CALCIUM 8.5* 7.9*  MG  --  1.6*   GFR: Estimated Creatinine Clearance: 144.7 mL/min (by C-G formula based on SCr of 0.8 mg/dL). Liver Function Tests:  Recent Labs Lab 03/02/17 1138  AST 22  ALT 25  ALKPHOS 110  BILITOT 1.3*  PROT 7.6  ALBUMIN 4.1    Recent Labs Lab 03/02/17 1138  LIPASE 20   No results for input(s): AMMONIA in the last 168 hours. Coagulation Profile: No results for input(s): INR, PROTIME in the last 168 hours. Cardiac Enzymes: No results for input(s): CKTOTAL, CKMB, CKMBINDEX, TROPONINI in the last 168 hours. BNP (last 3 results) No results for input(s): PROBNP in the last 8760 hours. HbA1C: No results for input(s): HGBA1C in the last 72 hours. CBG: No results for input(s): GLUCAP in the last 168 hours. Lipid Profile: No results for input(s): CHOL, HDL, LDLCALC, TRIG, CHOLHDL, LDLDIRECT in the last 72 hours. Thyroid Function Tests: No results for input(s): TSH, T4TOTAL, FREET4, T3FREE, THYROIDAB in the last 72 hours. Anemia Panel: No results for input(s): VITAMINB12, FOLATE, FERRITIN, TIBC, IRON, RETICCTPCT in the last 72 hours. Urine analysis:    Component Value Date/Time   COLORURINE AMBER (A) 03/02/2017 1130   APPEARANCEUR CLEAR 03/02/2017 1130   LABSPEC 1.024 03/02/2017 1130   PHURINE 7.5 03/02/2017 1130   GLUCOSEU NEGATIVE 03/02/2017 1130   HGBUR NEGATIVE 03/02/2017 1130   BILIRUBINUR NEGATIVE 03/02/2017 1130   KETONESUR NEGATIVE 03/02/2017 1130   PROTEINUR 30 (A) 03/02/2017 1130   NITRITE NEGATIVE 03/02/2017 1130   LEUKOCYTESUR NEGATIVE 03/02/2017 1130   Sepsis Labs: @LABRCNTIP (procalcitonin:4,lacticidven:4)  )No results found for this or any previous  visit (from the past 240 hour(s)).    Radiology Studies: Ct Abdomen Pelvis W Contrast  Result Date: 03/02/2017 CLINICAL DATA:  Pt with diffuse abdominal pain since yesterday, with nausea, pain severe in nature- worse with movement, elevated WBC EXAM: CT ABDOMEN AND PELVIS WITH CONTRAST TECHNIQUE: Multidetector CT imaging of the abdomen and pelvis was performed using the standard protocol following bolus administration of intravenous contrast. CONTRAST:  159mL ISOVUE-300 IOPAMIDOL (ISOVUE-300) INJECTION 61% COMPARISON:  08/13/2016; 05/17/2007 FINDINGS: Lower chest: Limited visualization of the lower thorax is negative for focal airspace opacity or pleural effusion. Minimal subsegmental atelectasis within the imaged bilateral lung bases. No focal airspace opacities. No pleural effusion. Normal heart size.  No pericardial effusion. Hepatobiliary: In need normal hepatic contour. No discrete hepatic lesions. Normal appearance of the gallbladder given degree distention.  No radiopaque gallstones. No intra extrahepatic biliary duct dilatation. No ascites. Pancreas: Normal appearance of the pancreas Spleen: Normal appearance of the spleen Adrenals/Urinary Tract: There is symmetric enhancement and excretion of the bilateral kidneys. Renal cysts are seen bilaterally with dominant exophytic cyst arising from the inferior pole the left kidney measuring approximately 5.4 cm in diameter. Additional bilateral subcentimeter hypoattenuating renal lesions are too small to adequately characterize of favored to represent additional renal cysts. No evidence of nephrolithiasis on this postcontrast examination. No urinary obstruction. There is a minimal amount of grossly symmetric bilateral perinephric stranding, presumably body habitus related. There is mild thickening involving the medial limb of the right adrenal gland without discrete nodule. Normal appearance of the left adrenal gland. Normal appearance of the urinary bladder  given degree distention. Stomach/Bowel: There is rather extensive colonic diverticulosis involving the distal aspect of the descending colon as well as the majority of the sigmoid colon with ill-defined stranding about the sigmoid colon located within the left lower abdominal quadrant/pelvis (representative image 69, series 2. This finding is associated with tiny amount of adjacent foci of pneumoperitoneum (representative images 73 and 75, series 2) as well as small amount of pneumoperitoneum within the nondependent portion of the upper abdomen (images 19, 21 and 25, series 2). No definable/drainable fluid collection. Moderate colonic stool burden without evidence of enteric obstruction. Note is made of a punctate (approximately 0.9 cm) intraluminal lipoma within the horizontal segment of the duodenum (image 46, series 2), without evidence of intussusception. Normal appearance of the terminal ileum and appendix. No pneumatosis or portal venous gas. Vascular/Lymphatic: Normal caliber the abdominal aorta. The major branch vessels of the abdominal aorta appear patent on this non CTA examination. No bulky retroperitoneal, mesenteric, pelvic or inguinal lymphadenopathy. Reproductive: Normal appearance of the pelvic organs. No free fluid in the pelvic cul-de-sac. Other: Small mesenteric fat containing left-sided inguinal hernia. Tiny mesenteric fat containing periumbilical hernia. Musculoskeletal: No acute or aggressive osseous abnormalities. Straightening and slight reversal the expected lumbar lordosis. 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 Bone island is noted within the left femoral neck. IMPRESSION: 1. The examination is positive for acute diverticulitis involving the sigmoid colon of the left lower abdomen/pelvis with associated microperforation and minimal amount of pneumoperitoneum. There is a minimal amount of adjacent ill-defined mesenteric stranding without evidence of definable/drainable  fluid collection. 2. Bilateral renal cysts. Critical Value/emergent results were called by telephone at the time of interpretation on 03/02/2017 at 2:10 pm to Dr. Sherwood Gambler , who verbally acknowledged these results. Electronically Signed   By: Sandi Mariscal M.D.   On: 03/02/2017 14:12     Scheduled Meds: . acetaminophen  650 mg Oral TID  . amLODipine  5 mg Oral Daily  . atorvastatin  20 mg Oral Daily  . chlorhexidine  15 mL Mouth Rinse BID  . enoxaparin (LOVENOX) injection  0.5 mg/kg Subcutaneous Q24H  . levothyroxine  75 mcg Oral QAC breakfast  . magnesium sulfate 1 - 4 g bolus IVPB  4 g Intravenous Once  . mouth rinse  15 mL Mouth Rinse q12n4p  . metoprolol succinate  100 mg Oral Daily  . piperacillin-tazobactam (ZOSYN)  IV  3.375 g Intravenous Q8H  . sodium chloride  2,000 mL Intravenous Once   Continuous Infusions: . dextrose 5 % and 0.45 % NaCl with KCl 20 mEq/L 125 mL/hr at 03/02/17 1805     LOS: 1 day    Time spent: 30 min  Janece Canterbury, MD [facility] Pager [phone]  If 7PM-7AM, please contact night-coverage [email or web address] Password [facility] 03/03/2017, 2:27 PM

## 2017-03-03 NOTE — Progress Notes (Signed)
Patient ID: Michael Liu, male   DOB: November 17, 1971, 46 y.o.   MRN: [id number] [facility] Surgery Progress Note:   * No surgery found *  Subjective: Mental status is alert and walking the halls Objective: Vital signs in last 24 hours: Temp:  [97.7 F (36.5 C)-99.3 F (37.4 C)] 98.6 F (37 C) (03/11 0507) Pulse Rate:  [69-88] 80 (03/11 0820) Resp:  [16-18] 18 (03/11 0507) BP: (106-156)/(67-88) 120/80 (03/11 0820) SpO2:  [92 %-98 %] 94 % (03/11 0507) Weight:  [117.9 kg (260 lb)] 117.9 kg (260 lb) (03/10 1127)  Intake/Output from previous day: 03/10 0701 - 03/11 0700 In: 3714.6 [I.V.:2614.6; IV Piggyback:1100] Out: 1675 [Urine:1675] Intake/Output this shift: Total I/O In: 0  Out: 175 [Urine:175]  Physical Exam: Work of breathing is not labored.  Pain is toward midline.    Lab Results:  Results for orders placed or performed during the hospital encounter of 03/02/17 (from the past 48 hour(s))  Urinalysis, Routine w reflex microscopic     Status: Abnormal   Collection Time: 03/02/17 11:30 AM  Result Value Ref Range   Color, Urine AMBER (A) YELLOW    Comment: BIOCHEMICALS MAY BE AFFECTED BY COLOR   APPearance CLEAR CLEAR   Specific Gravity, Urine 1.024 1.005 - 1.030   pH 7.5 5.0 - 8.0   Glucose, UA NEGATIVE NEGATIVE mg/dL   Hgb urine dipstick NEGATIVE NEGATIVE   Bilirubin Urine NEGATIVE NEGATIVE   Ketones, ur NEGATIVE NEGATIVE mg/dL   Protein, ur 30 (A) NEGATIVE mg/dL   Nitrite NEGATIVE NEGATIVE   Leukocytes, UA NEGATIVE NEGATIVE  Urinalysis, Microscopic (reflex)     Status: Abnormal   Collection Time: 03/02/17 11:30 AM  Result Value Ref Range   RBC / HPF 0-5 0 - 5 RBC/hpf   WBC, UA 0-5 0 - 5 WBC/hpf   Bacteria, UA FEW (A) NONE SEEN   Squamous Epithelial / LPF NONE SEEN NONE SEEN   Mucous PRESENT   CBC with Differential     Status: Abnormal   Collection Time: 03/02/17 11:38 AM  Result Value Ref Range   WBC 13.1 (H) 4.0 - 10.5 K/uL   RBC 4.81 4.22 - 5.81 MIL/uL   Hemoglobin 14.1 13.0 - 17.0 g/dL   HCT 40.9 39.0 - 52.0 %   MCV 85.0 78.0 - 100.0 fL   MCH 29.3 26.0 - 34.0 pg   MCHC 34.5 30.0 - 36.0 g/dL   RDW 13.8 11.5 - 15.5 %   Platelets 179 150 - 400 K/uL   Neutrophils Relative % 85 %   Neutro Abs 11.1 (H) 1.7 - 7.7 K/uL   Lymphocytes Relative 10 %   Lymphs Abs 1.3 0.7 - 4.0 K/uL   Monocytes Relative 5 %   Monocytes Absolute 0.7 0.1 - 1.0 K/uL   Eosinophils Relative 0 %   Eosinophils Absolute 0.0 0.0 - 0.7 K/uL   Basophils Relative 0 %   Basophils Absolute 0.0 0.0 - 0.1 K/uL  Comprehensive metabolic panel     Status: Abnormal   Collection Time: 03/02/17 11:38 AM  Result Value Ref Range   Sodium 140 135 - 145 mmol/L   Potassium 3.1 (L) 3.5 - 5.1 mmol/L   Chloride 103 101 - 111 mmol/L   CO2 29 22 - 32 mmol/L   Glucose, Bld 136 (H) 65 - 99 mg/dL   BUN 14 6 - 20 mg/dL   Creatinine, Ser 0.89 0.61 - 1.24 mg/dL   Calcium 8.5 (L) 8.9 - 10.3 mg/dL  Total Protein 7.6 6.5 - 8.1 g/dL   Albumin 4.1 3.5 - 5.0 g/dL   AST 22 15 - 41 U/L   ALT 25 17 - 63 U/L   Alkaline Phosphatase 110 38 - 126 U/L   Total Bilirubin 1.3 (H) 0.3 - 1.2 mg/dL   GFR calc non Af Amer >60 >60 mL/min   GFR calc Af Amer >60 >60 mL/min    Comment: (NOTE) The eGFR has been calculated using the CKD EPI equation. This calculation has not been validated in all clinical situations. eGFR's persistently <60 mL/min signify possible Chronic Kidney Disease.    Anion gap 8 5 - 15  Lipase, blood     Status: None   Collection Time: 03/02/17 11:38 AM  Result Value Ref Range   Lipase 20 11 - 51 U/L  Basic metabolic panel     Status: Abnormal   Collection Time: 03/03/17  4:56 AM  Result Value Ref Range   Sodium 139 135 - 145 mmol/L   Potassium 3.2 (L) 3.5 - 5.1 mmol/L   Chloride 107 101 - 111 mmol/L   CO2 26 22 - 32 mmol/L   Glucose, Bld 149 (H) 65 - 99 mg/dL   BUN 12 6 - 20 mg/dL   Creatinine, Ser 0.80 0.61 - 1.24 mg/dL   Calcium 7.9 (L) 8.9 - 10.3 mg/dL   GFR calc non Af  Amer >60 >60 mL/min   GFR calc Af Amer >60 >60 mL/min    Comment: (NOTE) The eGFR has been calculated using the CKD EPI equation. This calculation has not been validated in all clinical situations. eGFR's persistently <60 mL/min signify possible Chronic Kidney Disease.    Anion gap 6 5 - 15  CBC     Status: Abnormal   Collection Time: 03/03/17  4:56 AM  Result Value Ref Range   WBC 16.9 (H) 4.0 - 10.5 K/uL   RBC 4.28 4.22 - 5.81 MIL/uL   Hemoglobin 12.3 (L) 13.0 - 17.0 g/dL   HCT 36.8 (L) 39.0 - 52.0 %   MCV 86.0 78.0 - 100.0 fL   MCH 28.7 26.0 - 34.0 pg   MCHC 33.4 30.0 - 36.0 g/dL   RDW 14.2 11.5 - 15.5 %   Platelets 138 (L) 150 - 400 K/uL  Magnesium     Status: Abnormal   Collection Time: 03/03/17  4:56 AM  Result Value Ref Range   Magnesium 1.6 (L) 1.7 - 2.4 mg/dL    Radiology/Results: Ct Abdomen Pelvis W Contrast  Result Date: 03/02/2017 CLINICAL DATA:  Pt with diffuse abdominal pain since yesterday, with nausea, pain severe in nature- worse with movement, elevated WBC EXAM: CT ABDOMEN AND PELVIS WITH CONTRAST TECHNIQUE: Multidetector CT imaging of the abdomen and pelvis was performed using the standard protocol following bolus administration of intravenous contrast. CONTRAST:  149m ISOVUE-300 IOPAMIDOL (ISOVUE-300) INJECTION 61% COMPARISON:  08/13/2016; 05/17/2007 FINDINGS: Lower chest: Limited visualization of the lower thorax is negative for focal airspace opacity or pleural effusion. Minimal subsegmental atelectasis within the imaged bilateral lung bases. No focal airspace opacities. No pleural effusion. Normal heart size.  No pericardial effusion. Hepatobiliary: In need normal hepatic contour. No discrete hepatic lesions. Normal appearance of the gallbladder given degree distention. No radiopaque gallstones. No intra extrahepatic biliary duct dilatation. No ascites. Pancreas: Normal appearance of the pancreas Spleen: Normal appearance of the spleen Adrenals/Urinary Tract:  There is symmetric enhancement and excretion of the bilateral kidneys. Renal cysts are seen bilaterally with dominant exophytic  cyst arising from the inferior pole the left kidney measuring approximately 5.4 cm in diameter. Additional bilateral subcentimeter hypoattenuating renal lesions are too small to adequately characterize of favored to represent additional renal cysts. No evidence of nephrolithiasis on this postcontrast examination. No urinary obstruction. There is a minimal amount of grossly symmetric bilateral perinephric stranding, presumably body habitus related. There is mild thickening involving the medial limb of the right adrenal gland without discrete nodule. Normal appearance of the left adrenal gland. Normal appearance of the urinary bladder given degree distention. Stomach/Bowel: There is rather extensive colonic diverticulosis involving the distal aspect of the descending colon as well as the majority of the sigmoid colon with ill-defined stranding about the sigmoid colon located within the left lower abdominal quadrant/pelvis (representative image 69, series 2. This finding is associated with tiny amount of adjacent foci of pneumoperitoneum (representative images 73 and 75, series 2) as well as small amount of pneumoperitoneum within the nondependent portion of the upper abdomen (images 19, 21 and 25, series 2). No definable/drainable fluid collection. Moderate colonic stool burden without evidence of enteric obstruction. Note is made of a punctate (approximately 0.9 cm) intraluminal lipoma within the horizontal segment of the duodenum (image 46, series 2), without evidence of intussusception. Normal appearance of the terminal ileum and appendix. No pneumatosis or portal venous gas. Vascular/Lymphatic: Normal caliber the abdominal aorta. The major branch vessels of the abdominal aorta appear patent on this non CTA examination. No bulky retroperitoneal, mesenteric, pelvic or inguinal  lymphadenopathy. Reproductive: Normal appearance of the pelvic organs. No free fluid in the pelvic cul-de-sac. Other: Small mesenteric fat containing left-sided inguinal hernia. Tiny mesenteric fat containing periumbilical hernia. Musculoskeletal: No acute or aggressive osseous abnormalities. Straightening and slight reversal the expected lumbar lordosis. 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 Bone island is noted within the left femoral neck. IMPRESSION: 1. The examination is positive for acute diverticulitis involving the sigmoid colon of the left lower abdomen/pelvis with associated microperforation and minimal amount of pneumoperitoneum. There is a minimal amount of adjacent ill-defined mesenteric stranding without evidence of definable/drainable fluid collection. 2. Bilateral renal cysts. Critical Value/emergent results were called by telephone at the time of interpretation on 03/02/2017 at 2:10 pm to Dr. Sherwood Gambler , who verbally acknowledged these results. Electronically Signed   By: Sandi Mariscal M.D.   On: 03/02/2017 14:12    Anti-infectives: Anti-infectives    Start     Dose/Rate Route Frequency Ordered Stop   03/02/17 2000  piperacillin-tazobactam (ZOSYN) IVPB 3.375 g     3.375 g 12.5 mL/hr over 240 Minutes Intravenous Every 8 hours 03/02/17 1716     03/02/17 1415  piperacillin-tazobactam (ZOSYN) IVPB 3.375 g     3.375 g 100 mL/hr over 30 Minutes Intravenous  Once 03/02/17 1412 03/02/17 1505      Assessment/Plan: Problem List: Patient Active Problem List   Diagnosis Date Noted  . Diverticulitis of colon with perforation 03/02/2017  . Hypokalemia 03/02/2017  . Obesity (BMI 30-39.9)   . Hypothyroidism   . Hypertension   . High cholesterol   . BMI 50.0-59.9, adult ([place]) 07/19/2013    He appears to be stable with antibiotics thus far.  Would allow sips water and ice chips today.  Continue observation.  Hopefully can be managed medically.   * No surgery found *     LOS: 1 day   Matt B. Hassell Done, MD, [facility] [phone] beeper [phone]  03/03/2017 11:07 AM

## 2017-03-04 LAB — BASIC METABOLIC PANEL
ANION GAP: 8 (ref 5–15)
BUN: 10 mg/dL (ref 6–20)
CO2: 24 mmol/L (ref 22–32)
Calcium: 8.2 mg/dL — ABNORMAL LOW (ref 8.9–10.3)
Chloride: 107 mmol/L (ref 101–111)
Creatinine, Ser: 0.8 mg/dL (ref 0.61–1.24)
GFR calc non Af Amer: >60 mL/min (ref >60)
Glucose, Bld: 107 mg/dL — ABNORMAL HIGH (ref 65–99)
Potassium: 3 mmol/L — ABNORMAL LOW (ref 3.5–5.1)
Sodium: 139 mmol/L (ref 135–145)

## 2017-03-04 LAB — CBC
HEMATOCRIT: 39 % (ref 39.0–52.0)
HEMOGLOBIN: 13.2 g/dL (ref 13.0–17.0)
MCH: 29.3 pg (ref 26.0–34.0)
MCHC: 33.8 g/dL (ref 30.0–36.0)
MCV: 86.5 fL (ref 78.0–100.0)
Platelets: 150 10*3/uL (ref 150–400)
RBC: 4.51 MIL/uL (ref 4.22–5.81)
RDW: 14.1 % (ref 11.5–15.5)
WBC: 14.7 10*3/uL — ABNORMAL HIGH (ref 4.0–10.5)

## 2017-03-04 MED ORDER — MAGNESIUM SULFATE IN D5W 1-5 GM/100ML-% IV SOLN
1.0000 g | Freq: Once | INTRAVENOUS | Status: AC
  Administered 2017-03-04: 1 g via INTRAVENOUS
  Filled 2017-03-04: qty 100

## 2017-03-04 MED ORDER — POLYETHYLENE GLYCOL 3350 17 G PO PACK
17.0000 g | PACK | Freq: Every day | ORAL | Status: DC
  Administered 2017-03-04 – 2017-03-05 (×2): 17 g via ORAL
  Filled 2017-03-04 (×2): qty 1

## 2017-03-04 MED ORDER — SODIUM CHLORIDE 0.9 % IV SOLN
30.0000 meq | INTRAVENOUS | Status: AC
  Administered 2017-03-04 (×2): 30 meq via INTRAVENOUS
  Filled 2017-03-04 (×2): qty 15

## 2017-03-04 NOTE — Progress Notes (Signed)
Patient ID: Michael Liu, male   DOB: 1971-04-23, 46 y.o.   MRN: [id number]  [facility] Surgery Progress Note     Subjective: Patient states that he is feeling much better today. He has little to no abdominal pain at this point. Denies n/v. Tolerating sip/ice chips. +BM yesterday.  Objective: Vital signs in last 24 hours: Temp:  [99.1 F (37.3 C)-100 F (37.8 C)] 99.3 F (37.4 C) (03/12 0640) Pulse Rate:  [72-95] 95 (03/12 0640) Resp:  [16-20] 18 (03/12 0640) BP: (137-161)/(76-85) 161/82 (03/12 1021) SpO2:  [95 %-96 %] 95 % (03/12 0640) Last BM Date: 03/04/17  Intake/Output from previous day: 03/11 0701 - 03/12 0700 In: 1425 [I.V.:1375; IV Piggyback:50] Out: 475 [Urine:475] Intake/Output this shift: Total I/O In: 1500 [I.V.:1500] Out: 200 [Urine:200]  PE: Gen:  Alert, NAD, pleasant Pulm:  Effort normal Abd: obese, soft, NT/ND, +BS, no HSM Ext:  No erythema, edema, or tenderness   Lab Results:   Recent Labs  03/03/17 0456 03/04/17 0447  WBC 16.9* 14.7*  HGB 12.3* 13.2  HCT 36.8* 39.0  PLT 138* 150   BMET  Recent Labs  03/03/17 0456 03/04/17 0447  NA 139 139  K 3.2* 3.0*  CL 107 107  CO2 26 24  GLUCOSE 149* 107*  BUN 12 10  CREATININE 0.80 0.80  CALCIUM 7.9* 8.2*   PT/INR No results for input(s): LABPROT, INR in the last 72 hours. CMP     Component Value Date/Time   NA 139 03/04/2017 0447   K 3.0 (L) 03/04/2017 0447   CL 107 03/04/2017 0447   CO2 24 03/04/2017 0447   GLUCOSE 107 (H) 03/04/2017 0447   BUN 10 03/04/2017 0447   CREATININE 0.80 03/04/2017 0447   CALCIUM 8.2 (L) 03/04/2017 0447   PROT 7.6 03/02/2017 1138   ALBUMIN 4.1 03/02/2017 1138   AST 22 03/02/2017 1138   ALT 25 03/02/2017 1138   ALKPHOS 110 03/02/2017 1138   BILITOT 1.3 (H) 03/02/2017 1138   GFRNONAA >60 03/04/2017 0447   GFRAA >60 03/04/2017 0447   Lipase     Component Value Date/Time   LIPASE 20 03/02/2017 1138       Studies/Results: Ct Abdomen Pelvis  W Contrast  Result Date: 03/02/2017 CLINICAL DATA:  Pt with diffuse abdominal pain since yesterday, with nausea, pain severe in nature- worse with movement, elevated WBC EXAM: CT ABDOMEN AND PELVIS WITH CONTRAST TECHNIQUE: Multidetector CT imaging of the abdomen and pelvis was performed using the standard protocol following bolus administration of intravenous contrast. CONTRAST:  176mL ISOVUE-300 IOPAMIDOL (ISOVUE-300) INJECTION 61% COMPARISON:  08/13/2016; 05/17/2007 FINDINGS: Lower chest: Limited visualization of the lower thorax is negative for focal airspace opacity or pleural effusion. Minimal subsegmental atelectasis within the imaged bilateral lung bases. No focal airspace opacities. No pleural effusion. Normal heart size.  No pericardial effusion. Hepatobiliary: In need normal hepatic contour. No discrete hepatic lesions. Normal appearance of the gallbladder given degree distention. No radiopaque gallstones. No intra extrahepatic biliary duct dilatation. No ascites. Pancreas: Normal appearance of the pancreas Spleen: Normal appearance of the spleen Adrenals/Urinary Tract: There is symmetric enhancement and excretion of the bilateral kidneys. Renal cysts are seen bilaterally with dominant exophytic cyst arising from the inferior pole the left kidney measuring approximately 5.4 cm in diameter. Additional bilateral subcentimeter hypoattenuating renal lesions are too small to adequately characterize of favored to represent additional renal cysts. No evidence of nephrolithiasis on this postcontrast examination. No urinary obstruction. There is a minimal  amount of grossly symmetric bilateral perinephric stranding, presumably body habitus related. There is mild thickening involving the medial limb of the right adrenal gland without discrete nodule. Normal appearance of the left adrenal gland. Normal appearance of the urinary bladder given degree distention. Stomach/Bowel: There is rather extensive colonic  diverticulosis involving the distal aspect of the descending colon as well as the majority of the sigmoid colon with ill-defined stranding about the sigmoid colon located within the left lower abdominal quadrant/pelvis (representative image 69, series 2. This finding is associated with tiny amount of adjacent foci of pneumoperitoneum (representative images 73 and 75, series 2) as well as small amount of pneumoperitoneum within the nondependent portion of the upper abdomen (images 19, 21 and 25, series 2). No definable/drainable fluid collection. Moderate colonic stool burden without evidence of enteric obstruction. Note is made of a punctate (approximately 0.9 cm) intraluminal lipoma within the horizontal segment of the duodenum (image 46, series 2), without evidence of intussusception. Normal appearance of the terminal ileum and appendix. No pneumatosis or portal venous gas. Vascular/Lymphatic: Normal caliber the abdominal aorta. The major branch vessels of the abdominal aorta appear patent on this non CTA examination. No bulky retroperitoneal, mesenteric, pelvic or inguinal lymphadenopathy. Reproductive: Normal appearance of the pelvic organs. No free fluid in the pelvic cul-de-sac. Other: Small mesenteric fat containing left-sided inguinal hernia. Tiny mesenteric fat containing periumbilical hernia. Musculoskeletal: No acute or aggressive osseous abnormalities. Straightening and slight reversal the expected lumbar lordosis. 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 Bone island is noted within the left femoral neck. IMPRESSION: 1. The examination is positive for acute diverticulitis involving the sigmoid colon of the left lower abdomen/pelvis with associated microperforation and minimal amount of pneumoperitoneum. There is a minimal amount of adjacent ill-defined mesenteric stranding without evidence of definable/drainable fluid collection. 2. Bilateral renal cysts. Critical Value/emergent results  were called by telephone at the time of interpretation on 03/02/2017 at 2:10 pm to Dr. Sherwood Gambler , who verbally acknowledged these results. Electronically Signed   By: Sandi Mariscal M.D.   On: 03/02/2017 14:12    Anti-infectives: Anti-infectives    Start     Dose/Rate Route Frequency Ordered Stop   03/02/17 2000  piperacillin-tazobactam (ZOSYN) IVPB 3.375 g     3.375 g 12.5 mL/hr over 240 Minutes Intravenous Every 8 hours 03/02/17 1716     03/02/17 1415  piperacillin-tazobactam (ZOSYN) IVPB 3.375 g     3.375 g 100 mL/hr over 30 Minutes Intravenous  Once 03/02/17 1412 03/02/17 1505       Assessment/Plan Sigmoid diverticulitis with microperforation - 2nd flare-up, last episode 07/2016 treated with PO antibiotics - last colonoscopy about 2 years ago - this admission CT scan showed acute diverticulitis involving the sigmoid colon of the left lower abdomen/pelvis with associated microperforation and minimal amount of pneumoperitoneum - WBC is trending down  HTN HLD Hypothyroidism Obesity  ID - zosyn 3/10>> FEN - clear liquids, add miralax VTE - lovenox  Plan - WBC is trending down and abdominal pain improving. Advance to clear liquid diet. If patient tolerates clears this morning ok to advance to full liquids for dinner. Continue IV antibiotics for now. If he continues to improve will plan to d/c home on 2 weeks of oral antibiotics with OP follow-up with Dr. Marlou Starks.   LOS: 2 days    [name] , [facility] Surgery 03/04/2017, 10:25 AM Pager: [phone] Consults: [phone] Mon-Fri 7:00 am-4:30 pm Sat-Sun 7:00 am-11:30 am

## 2017-03-04 NOTE — Progress Notes (Signed)
PROGRESS NOTE  Michael Liu  KCL:[id number] DOB: Oct 19, 1971 DOA: 03/02/2017 PCP: Marton Redwood, MD  Brief Narrative:   Michael Liu is a 46 y.o. male with medical history significant of hypertension, hyperlipidemia, and kidney stones who presented to [facility] with a 1-day history of rapidly worsening abdominal pain.  CT scan demonstrated extensive diverticulitis of the descending and sigmoid colon with microperforation and without abscess.  General surgery consulting.  Patient feels clinically improved with IVF, antibiotics, and pain medication.    Assessment & Plan:   Principal Problem:   Diverticulitis of colon with perforation Active Problems:   Obesity (BMI 30-39.9)   Hypothyroidism   Hypertension   High cholesterol   Hypokalemia  Colonic diverticulosis involving the descending colon and majority of the sigmoid colon with adjacent pneumoperitoneum as well as some pneumoperitoneum in the nondependent upper abdomen.  Stranding but no drainable abscess -  Advance to CLD -  Start miralax once daily -  Continue IVF -  Continue scheduled tylenol -  Continue prn ibuprofen -  Dilaudid for breakthrough pain -  Continue zosyn -  General surgery consult appreciated  Bilateral renal cysts with a dominant exophytic cyst arising from the inferior pole of the left kidney 5.4 cm in diameter, likely has polycystic kidney disease.  Currently no evidence of CKD.    Essential hypertension, blood pressures mildly elevated secondary to pain  -  Control pain -  Continue norvasc, metoprolol  Hyperlipidemia, stable, continue atorvastatin  Hypothyroidism, stable, continue synthroid  Hypokalemia -  IV potassium chloride 30 meq x 2 today -  Additional magnesium   Obesity, BMI 30-39.  Currently NPO.  Defer weight loss strategy to PCP.  DVT prophylaxis: lovenox  Code Status: full code Family Communication: patient alone  Disposition Plan:  home in 2 days depending on progression.  Need  to advance diet slowly/tolerate diet and have BMs prior to discharge.  Will need close outpatient follow up with surgery.    Consultants:   General surgery  Procedures:  none  Antimicrobials:  Anti-infectives    Start     Dose/Rate Route Frequency Ordered Stop   03/02/17 2000  piperacillin-tazobactam (ZOSYN) IVPB 3.375 g     3.375 g 12.5 mL/hr over 240 Minutes Intravenous Every 8 hours 03/02/17 1716     03/02/17 1415  piperacillin-tazobactam (ZOSYN) IVPB 3.375 g     3.375 g 100 mL/hr over 30 Minutes Intravenous  Once 03/02/17 1412 03/02/17 1505       Subjective: Abdominal pain improving.  Had a BM and passing flatus.  Abdominal bloating is improving.  Feels relieved that things are feeling better.  Denies abdominal pain and nausea today.    Objective: Vitals:   03/04/17 0640 03/04/17 1021 03/04/17 1027 03/04/17 1216  BP: (!) 150/85 (!) 161/82    Pulse: 95  92   Resp: 18     Temp: 99.3 F (37.4 C)   99.6 F (37.6 C)  TempSrc: Oral   Oral  SpO2: 95%     Weight:      Height:        Intake/Output Summary (Last 24 hours) at 03/04/17 1344 Last data filed at 03/04/17 1000  Gross per 24 hour  Intake             2550 ml  Output              500 ml  Net  2050 ml   Filed Weights   03/02/17 1127  Weight: 117.9 kg (260 lb)    Examination:  General exam:  Adult male.  No acute distress.  HEENT:  NCAT, mildly dry MM Respiratory system: Clear to auscultation bilaterally Cardiovascular system: Regular rate and rhythm, normal S1/S2. No murmurs, rubs, gallops or clicks.  Warm extremities Gastrointestinal system: normal active bowel sounds, lower pitched, moderately distended, mild TTP near the umbilicus but no rebound or guarding MSK:  Normal tone and bulk, no lower extremity edema Neuro:  Grossly intact.      Data Reviewed: I have personally reviewed following labs and imaging studies  CBC:  Recent Labs Lab 03/02/17 1138 03/03/17 0456 03/04/17 0447    WBC 13.1* 16.9* 14.7*  NEUTROABS 11.1*  --   --   HGB 14.1 12.3* 13.2  HCT 40.9 36.8* 39.0  MCV 85.0 86.0 86.5  PLT 179 138* 539   Basic Metabolic Panel:  Recent Labs Lab 03/02/17 1138 03/03/17 0456 03/04/17 0447  NA 140 139 139  K 3.1* 3.2* 3.0*  CL 103 107 107  CO2 29 26 24   GLUCOSE 136* 149* 107*  BUN 14 12 10   CREATININE 0.89 0.80 0.80  CALCIUM 8.5* 7.9* 8.2*  MG  --  1.6*  --    GFR: Estimated Creatinine Clearance: 144.7 mL/min (by C-G formula based on SCr of 0.8 mg/dL). Liver Function Tests:  Recent Labs Lab 03/02/17 1138  AST 22  ALT 25  ALKPHOS 110  BILITOT 1.3*  PROT 7.6  ALBUMIN 4.1    Recent Labs Lab 03/02/17 1138  LIPASE 20   No results for input(s): AMMONIA in the last 168 hours. Coagulation Profile: No results for input(s): INR, PROTIME in the last 168 hours. Cardiac Enzymes: No results for input(s): CKTOTAL, CKMB, CKMBINDEX, TROPONINI in the last 168 hours. BNP (last 3 results) No results for input(s): PROBNP in the last 8760 hours. HbA1C: No results for input(s): HGBA1C in the last 72 hours. CBG: No results for input(s): GLUCAP in the last 168 hours. Lipid Profile: No results for input(s): CHOL, HDL, LDLCALC, TRIG, CHOLHDL, LDLDIRECT in the last 72 hours. Thyroid Function Tests: No results for input(s): TSH, T4TOTAL, FREET4, T3FREE, THYROIDAB in the last 72 hours. Anemia Panel: No results for input(s): VITAMINB12, FOLATE, FERRITIN, TIBC, IRON, RETICCTPCT in the last 72 hours. Urine analysis:    Component Value Date/Time   COLORURINE AMBER (A) 03/02/2017 1130   APPEARANCEUR CLEAR 03/02/2017 1130   LABSPEC 1.024 03/02/2017 1130   PHURINE 7.5 03/02/2017 1130   GLUCOSEU NEGATIVE 03/02/2017 1130   HGBUR NEGATIVE 03/02/2017 1130   BILIRUBINUR NEGATIVE 03/02/2017 1130   KETONESUR NEGATIVE 03/02/2017 1130   PROTEINUR 30 (A) 03/02/2017 1130   NITRITE NEGATIVE 03/02/2017 1130   LEUKOCYTESUR NEGATIVE 03/02/2017 1130   Sepsis  Labs: @LABRCNTIP (procalcitonin:4,lacticidven:4)  )No results found for this or any previous visit (from the past 240 hour(s)).    Radiology Studies: Ct Abdomen Pelvis W Contrast  Result Date: 03/02/2017 CLINICAL DATA:  Pt with diffuse abdominal pain since yesterday, with nausea, pain severe in nature- worse with movement, elevated WBC EXAM: CT ABDOMEN AND PELVIS WITH CONTRAST TECHNIQUE: Multidetector CT imaging of the abdomen and pelvis was performed using the standard protocol following bolus administration of intravenous contrast. CONTRAST:  169mL ISOVUE-300 IOPAMIDOL (ISOVUE-300) INJECTION 61% COMPARISON:  08/13/2016; 05/17/2007 FINDINGS: Lower chest: Limited visualization of the lower thorax is negative for focal airspace opacity or pleural effusion. Minimal subsegmental atelectasis within the imaged  bilateral lung bases. No focal airspace opacities. No pleural effusion. Normal heart size.  No pericardial effusion. Hepatobiliary: In need normal hepatic contour. No discrete hepatic lesions. Normal appearance of the gallbladder given degree distention. No radiopaque gallstones. No intra extrahepatic biliary duct dilatation. No ascites. Pancreas: Normal appearance of the pancreas Spleen: Normal appearance of the spleen Adrenals/Urinary Tract: There is symmetric enhancement and excretion of the bilateral kidneys. Renal cysts are seen bilaterally with dominant exophytic cyst arising from the inferior pole the left kidney measuring approximately 5.4 cm in diameter. Additional bilateral subcentimeter hypoattenuating renal lesions are too small to adequately characterize of favored to represent additional renal cysts. No evidence of nephrolithiasis on this postcontrast examination. No urinary obstruction. There is a minimal amount of grossly symmetric bilateral perinephric stranding, presumably body habitus related. There is mild thickening involving the medial limb of the right adrenal gland without discrete  nodule. Normal appearance of the left adrenal gland. Normal appearance of the urinary bladder given degree distention. Stomach/Bowel: There is rather extensive colonic diverticulosis involving the distal aspect of the descending colon as well as the majority of the sigmoid colon with ill-defined stranding about the sigmoid colon located within the left lower abdominal quadrant/pelvis (representative image 69, series 2. This finding is associated with tiny amount of adjacent foci of pneumoperitoneum (representative images 73 and 75, series 2) as well as small amount of pneumoperitoneum within the nondependent portion of the upper abdomen (images 19, 21 and 25, series 2). No definable/drainable fluid collection. Moderate colonic stool burden without evidence of enteric obstruction. Note is made of a punctate (approximately 0.9 cm) intraluminal lipoma within the horizontal segment of the duodenum (image 46, series 2), without evidence of intussusception. Normal appearance of the terminal ileum and appendix. No pneumatosis or portal venous gas. Vascular/Lymphatic: Normal caliber the abdominal aorta. The major branch vessels of the abdominal aorta appear patent on this non CTA examination. No bulky retroperitoneal, mesenteric, pelvic or inguinal lymphadenopathy. Reproductive: Normal appearance of the pelvic organs. No free fluid in the pelvic cul-de-sac. Other: Small mesenteric fat containing left-sided inguinal hernia. Tiny mesenteric fat containing periumbilical hernia. Musculoskeletal: No acute or aggressive osseous abnormalities. Straightening and slight reversal the expected lumbar lordosis. 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 Bone island is noted within the left femoral neck. IMPRESSION: 1. The examination is positive for acute diverticulitis involving the sigmoid colon of the left lower abdomen/pelvis with associated microperforation and minimal amount of pneumoperitoneum. There is a minimal  amount of adjacent ill-defined mesenteric stranding without evidence of definable/drainable fluid collection. 2. Bilateral renal cysts. Critical Value/emergent results were called by telephone at the time of interpretation on 03/02/2017 at 2:10 pm to Dr. Sherwood Gambler , who verbally acknowledged these results. Electronically Signed   By: Sandi Mariscal M.D.   On: 03/02/2017 14:12     Scheduled Meds: . acetaminophen  650 mg Oral TID  . amLODipine  5 mg Oral Daily  . atorvastatin  20 mg Oral Daily  . chlorhexidine  15 mL Mouth Rinse BID  . enoxaparin (LOVENOX) injection  0.5 mg/kg Subcutaneous Q24H  . levothyroxine  75 mcg Oral QAC breakfast  . mouth rinse  15 mL Mouth Rinse q12n4p  . metoprolol succinate  100 mg Oral Daily  . piperacillin-tazobactam (ZOSYN)  IV  3.375 g Intravenous Q8H  . polyethylene glycol  17 g Oral Daily  . potassium chloride (KCL MULTIRUN) 30 mEq in 265 mL IVPB  30 mEq Intravenous Q3H  Continuous Infusions: . dextrose 5 % and 0.45 % NaCl with KCl 20 mEq/L 1,000 mL (03/04/17 0438)     LOS: 2 days    Time spent: 30 min    Janece Canterbury, MD [facility] Pager [phone]  If 7PM-7AM, please contact night-coverage [email or web address] Password [facility] 03/04/2017, 1:44 PM

## 2017-03-05 LAB — CBC
HEMATOCRIT: 37.1 % — AB (ref 39.0–52.0)
HEMOGLOBIN: 12.7 g/dL — AB (ref 13.0–17.0)
MCH: 28.5 pg (ref 26.0–34.0)
MCHC: 34.2 g/dL (ref 30.0–36.0)
MCV: 83.4 fL (ref 78.0–100.0)
Platelets: 160 10*3/uL (ref 150–400)
RBC: 4.45 MIL/uL (ref 4.22–5.81)
RDW: 13.9 % (ref 11.5–15.5)
WBC: 10.9 10*3/uL — AB (ref 4.0–10.5)

## 2017-03-05 LAB — BASIC METABOLIC PANEL
ANION GAP: 4 — AB (ref 5–15)
BUN: 6 mg/dL (ref 6–20)
CALCIUM: 8.2 mg/dL — AB (ref 8.9–10.3)
CO2: 29 mmol/L (ref 22–32)
Chloride: 106 mmol/L (ref 101–111)
Creatinine, Ser: 0.76 mg/dL (ref 0.61–1.24)
GLUCOSE: 117 mg/dL — AB (ref 65–99)
POTASSIUM: 3 mmol/L — AB (ref 3.5–5.1)
Sodium: 139 mmol/L (ref 135–145)

## 2017-03-05 MED ORDER — POTASSIUM CHLORIDE CRYS ER 20 MEQ PO TBCR
40.0000 meq | EXTENDED_RELEASE_TABLET | ORAL | Status: AC
  Administered 2017-03-05 (×2): 40 meq via ORAL
  Filled 2017-03-05 (×2): qty 2

## 2017-03-05 MED ORDER — POLYETHYLENE GLYCOL 3350 17 GM/SCOOP PO POWD: 17.0000 g | Freq: Every day | ORAL | 0 refills | Status: DC

## 2017-03-05 MED ORDER — SODIUM CHLORIDE 0.9 % IV SOLN: 30.0000 meq | INTRAVENOUS | Status: DC

## 2017-03-05 MED ORDER — AMOXICILLIN-POT CLAVULANATE 875-125 MG PO TABS: 1.0000 | ORAL_TABLET | Freq: Two times a day (BID) | ORAL | 0 refills | Status: DC

## 2017-03-05 NOTE — Progress Notes (Signed)
Patient ID: Michael Liu, male   DOB: 06-Sep-1971, 46 y.o.   MRN: [id number]  [facility] Surgery Progress Note     Subjective: Feeling "great" this morning. Tolerating full liquids. He denies any current abdominal pain, nausea, or vomiting. He is having bowel movements.   Objective: Vital signs in last 24 hours: Temp:  [98.4 F (36.9 C)-99.6 F (37.6 C)] 98.5 F (36.9 C) (03/13 0500) Pulse Rate:  [79-92] 87 (03/13 0500) Resp:  [18] 18 (03/13 0500) BP: (130-161)/(82-95) 130/95 (03/13 0500) SpO2:  [94 %-98 %] 98 % (03/13 0500) Last BM Date: 03/05/17  Intake/Output from previous day: 03/12 0701 - 03/13 0700 In: 4804.2 [P.O.:400; I.V.:4354.2; IV Piggyback:50] Out: 450 [Urine:450] Intake/Output this shift: No intake/output data recorded.  PE: Gen:  Alert, NAD, pleasant Pulm:  Effort normal Abd: obese, soft, NT/ND, +BS, no HSM Ext:  No erythema, edema, or tenderness   Lab Results:   Recent Labs  03/04/17 0447 03/05/17 0528  WBC 14.7* 10.9*  HGB 13.2 12.7*  HCT 39.0 37.1*  PLT 150 160   BMET  Recent Labs  03/04/17 0447 03/05/17 0528  NA 139 139  K 3.0* 3.0*  CL 107 106  CO2 24 29  GLUCOSE 107* 117*  BUN 10 6  CREATININE 0.80 0.76  CALCIUM 8.2* 8.2*   PT/INR No results for input(s): LABPROT, INR in the last 72 hours. CMP     Component Value Date/Time   NA 139 03/05/2017 0528   K 3.0 (L) 03/05/2017 0528   CL 106 03/05/2017 0528   CO2 29 03/05/2017 0528   GLUCOSE 117 (H) 03/05/2017 0528   BUN 6 03/05/2017 0528   CREATININE 0.76 03/05/2017 0528   CALCIUM 8.2 (L) 03/05/2017 0528   PROT 7.6 03/02/2017 1138   ALBUMIN 4.1 03/02/2017 1138   AST 22 03/02/2017 1138   ALT 25 03/02/2017 1138   ALKPHOS 110 03/02/2017 1138   BILITOT 1.3 (H) 03/02/2017 1138   GFRNONAA >60 03/05/2017 0528   GFRAA >60 03/05/2017 0528   Lipase     Component Value Date/Time   LIPASE 20 03/02/2017 1138       Studies/Results: No results  found.  Anti-infectives: Anti-infectives    Start     Dose/Rate Route Frequency Ordered Stop   03/02/17 2000  piperacillin-tazobactam (ZOSYN) IVPB 3.375 g     3.375 g 12.5 mL/hr over 240 Minutes Intravenous Every 8 hours 03/02/17 1716     03/02/17 1415  piperacillin-tazobactam (ZOSYN) IVPB 3.375 g     3.375 g 100 mL/hr over 30 Minutes Intravenous  Once 03/02/17 1412 03/02/17 1505       Assessment/Plan Sigmoid diverticulitis with microperforation - 2nd flare-up, last episode 07/2016 treated with PO antibiotics - last colonoscopy about 2 years ago - this admission CT scan showed acute diverticulitis involving the sigmoid colon of the left lower abdomen/pelvis with associated microperforation and minimal amount of pneumoperitoneum - WBC is trending down  HTN HLD Hypothyroidism Obesity  ID - zosyn 3/10>> FEN - soft diet VTE - lovenox  Plan - WBC continues to trend down and abdominal pain improving. Advance to soft diet. If patient tolerates this he will be ready for discharge today from a surgical standpoint. Would recommend PO antibiotics x2 weeks, and follow-up with Dr. Marlou Starks in 4 weeks. He will likely need colonscopy in about 6-8 weeks once this acute episode resolves.   LOS: 3 days    [name] , [facility] Surgery 03/05/2017, 9:56  AM Pager: [phone] Consults: [phone] Mon-Fri 7:00 am-4:30 pm Sat-Sun 7:00 am-11:30 am

## 2017-03-05 NOTE — Progress Notes (Signed)
Pt is ambulating and tolerating soft diet.  Pain is minimal but BP is elevated.  MD was made aware and he was instructed to follow up with his primary care doctor.  D/C instructions were given and understanding was verbalized.

## 2017-03-05 NOTE — Plan of Care (Signed)
Problem: Food- and Nutrition-Related Knowledge Deficit (NB-1.1) Goal: Nutrition education Formal process to instruct or train a patient/client in a skill or to impart knowledge to help patients/clients voluntarily manage or modify food choices and eating behavior to maintain or improve health. Outcome: Completed/Met Date Met: 03/05/17 Nutrition Education Note  RD consulted for nutrition education regarding a low fiber diet for diverticulitis.  RD provided "Fiber Restricted Nutrition Therapy" handout from the [employer] of [employer]. Reviewed patient's dietary recall. Provided examples of low and high fiber foods. Discouraged intake of high fiber foods, processed foods, caffeine and red meats. Encouraged use of a multi-vitamin while following a low fiber diet. Teach back method used.  Expect good compliance with support from wife.  Body mass index is 41.87 kg/m. Pt meets criteria for morbid obesity based on current BMI.  Current diet order is soft, patient is consuming approximately 25% of meals at this time. Labs and medications reviewed. No further nutrition interventions warranted at this time. If additional nutrition issues arise, please re-consult RD.  Clayton Bibles, MS, RD, LDN Pager: [phone] After Hours Pager: [phone]

## 2017-03-05 NOTE — Discharge Instructions (Signed)

## 2017-03-05 NOTE — Discharge Summary (Signed)
Physician Discharge Summary  Michael Liu WCH:[id number] DOB: [birth year] DOA: 03/02/2017  PCP: Marton Redwood, MD  Admit date: 03/02/2017 Discharge date: 03/05/2017  Admitted From: home  Disposition:  home  Recommendations for Outpatient Follow-up:  1. Follow up with General Surgery in 4 weeks, Dr. Marlou Starks 2. Augmentin for a total of 14-days of antibiotics 3. PCP to monitor kidney function due to polycystic kidney disease  Home Health:  none  Equipment/Devices:  None  Discharge Condition:  Stable, improved CODE STATUS:  Full code  Diet recommendation:  High fiber   Brief/Interim Summary:  Michael Liu a 46 y.o.malewith medical history significant of hypertension, hyperlipidemia, and kidney stones who presented to [facility] with a 1-day history of rapidly worsening abdominal pain.  CT scan demonstrated extensive diverticulitis of the descending and sigmoid colon with microperforation and without abscess. General surgery was consulted.  Patient felt clinically improved with IVF, antibiotics, and pain medication.  He did not require surgery.  He will continue antibiotics for two weeks and then follow up in 4 weeks with Dr. Marlou Starks as an outpatient for reevaluation.    Discharge Diagnoses:  Principal Problem:   Diverticulitis of colon with perforation Active Problems:   Obesity (BMI 30-39.9)   Hypothyroidism   Hypertension   High cholesterol   Hypokalemia  Colonic diverticulosis involving the descending colon and majority of the sigmoid colon with adjacent pneumoperitoneum as well as some pneumoperitoneum in the nondependent upper abdomen. Stranding but no drainable abscess.  He was given bowel rest, IV fluids, pain medication, antiemetics and gradually had improvement in his abdominal pain.  He started passing spontaneous bowel movements.  Although his WBC count initially rose, it quickly trended back down again and was near normal at the time of discharge.  His diet was gradually advanced  and he was able to tolerate a regular diet prior to discharge.  Recommend that he follow up with general surgery in 4 weeks for reevaluation. He should eat a high fiber diet, drink at least 1.5 L of fluid each day, and use miralax to prevent constipation  Bilateral renal cysts with a dominant exophytic cyst arising from the inferior pole of the left kidney 5.4 cm in diameter, likely has polycystic kidney disease. Currently no evidence of CKD. Recommend follow up with PCP for polycystic kidney disease.    Essential hypertension, blood pressures mildly elevated secondary to pain.  Improved with pain control.   - Continued norvasc, metoprolol  Hyperlipidemia, stable, continued atorvastatin  Hypothyroidism, stable, continued synthroid  Hypokalemia, improved with IV and oral supplementation.  He was also given IV magnesium for his hypomagnesemia.    Obesity, BMI 30-39. Defer weight loss strategy to PCP  Discharge Instructions  Discharge Instructions    Call MD for:  difficulty breathing, headache or visual disturbances    Complete by:  As directed    Call MD for:  extreme fatigue    Complete by:  As directed    Call MD for:  hives    Complete by:  As directed    Call MD for:  persistant dizziness or light-headedness    Complete by:  As directed    Call MD for:  persistant nausea and vomiting    Complete by:  As directed    Call MD for:  severe uncontrolled pain    Complete by:  As directed    Call MD for:  temperature >100.4    Complete by:  As directed    Diet -  low sodium heart healthy    Complete by:  As directed    Increase activity slowly    Complete by:  As directed        Medication List    STOP taking these medications   ibuprofen 200 MG tablet Commonly known as:  ADVIL,MOTRIN     TAKE these medications   amLODipine 5 MG tablet Commonly known as:  NORVASC Take 5 mg by mouth daily.   amoxicillin-clavulanate 875-125 MG tablet Commonly known as:   AUGMENTIN Take 1 tablet by mouth 2 (two) times daily.   atorvastatin 20 MG tablet Commonly known as:  LIPITOR Take 20 mg by mouth every morning.   levothyroxine 75 MCG tablet Commonly known as:  SYNTHROID, LEVOTHROID Take 75 mcg by mouth daily before breakfast.   metoprolol succinate 100 MG 24 hr tablet Commonly known as:  TOPROL-XL Take 100 mg by mouth daily.   polyethylene glycol powder powder Commonly known as:  MIRALAX Take 17 g by mouth daily.   potassium chloride SA 20 MEQ tablet Commonly known as:  K-DUR,KLOR-CON Take 20 mEq by mouth every morning.   zolpidem 12.5 MG CR tablet Commonly known as:  AMBIEN CR Take 12.5 mg by mouth at bedtime.      Follow-up Information    TOTH III,PAUL S, MD. Schedule an appointment as soon as possible for a visit in 4 week(s).   Specialty:  General Surgery Contact information: [address] [place] [address] [phone]        Marton Redwood, MD. Schedule an appointment as soon as possible for a visit in 1 week(s).   Specialty:  Internal Medicine Why:  diverticulitis and follow up blood pressure Contact information: [address] [place] [address] [phone]          Allergies  Allergen Reactions  . Percocet [Oxycodone-Acetaminophen] Other (See Comments)    Made him feel funny    Consultations: General surgery  Procedures/Studies: Ct Abdomen Pelvis W Contrast  Result Date: 03/02/2017 CLINICAL DATA:  Pt with diffuse abdominal pain since yesterday, with nausea, pain severe in nature- worse with movement, elevated WBC EXAM: CT ABDOMEN AND PELVIS WITH CONTRAST TECHNIQUE: Multidetector CT imaging of the abdomen and pelvis was performed using the standard protocol following bolus administration of intravenous contrast. CONTRAST:  151mL ISOVUE-300 IOPAMIDOL (ISOVUE-300) INJECTION 61% COMPARISON:  08/13/2016; 05/17/2007 FINDINGS: Lower chest: Limited visualization of the lower thorax is negative for  focal airspace opacity or pleural effusion. Minimal subsegmental atelectasis within the imaged bilateral lung bases. No focal airspace opacities. No pleural effusion. Normal heart size.  No pericardial effusion. Hepatobiliary: In need normal hepatic contour. No discrete hepatic lesions. Normal appearance of the gallbladder given degree distention. No radiopaque gallstones. No intra extrahepatic biliary duct dilatation. No ascites. Pancreas: Normal appearance of the pancreas Spleen: Normal appearance of the spleen Adrenals/Urinary Tract: There is symmetric enhancement and excretion of the bilateral kidneys. Renal cysts are seen bilaterally with dominant exophytic cyst arising from the inferior pole the left kidney measuring approximately 5.4 cm in diameter. Additional bilateral subcentimeter hypoattenuating renal lesions are too small to adequately characterize of favored to represent additional renal cysts. No evidence of nephrolithiasis on this postcontrast examination. No urinary obstruction. There is a minimal amount of grossly symmetric bilateral perinephric stranding, presumably body habitus related. There is mild thickening involving the medial limb of the right adrenal gland without discrete nodule. Normal appearance of the left adrenal gland. Normal appearance of the urinary  bladder given degree distention. Stomach/Bowel: There is rather extensive colonic diverticulosis involving the distal aspect of the descending colon as well as the majority of the sigmoid colon with ill-defined stranding about the sigmoid colon located within the left lower abdominal quadrant/pelvis (representative image 69, series 2. This finding is associated with tiny amount of adjacent foci of pneumoperitoneum (representative images 73 and 75, series 2) as well as small amount of pneumoperitoneum within the nondependent portion of the upper abdomen (images 19, 21 and 25, series 2). No definable/drainable fluid collection. Moderate  colonic stool burden without evidence of enteric obstruction. Note is made of a punctate (approximately 0.9 cm) intraluminal lipoma within the horizontal segment of the duodenum (image 46, series 2), without evidence of intussusception. Normal appearance of the terminal ileum and appendix. No pneumatosis or portal venous gas. Vascular/Lymphatic: Normal caliber the abdominal aorta. The major branch vessels of the abdominal aorta appear patent on this non CTA examination. No bulky retroperitoneal, mesenteric, pelvic or inguinal lymphadenopathy. Reproductive: Normal appearance of the pelvic organs. No free fluid in the pelvic cul-de-sac. Other: Small mesenteric fat containing left-sided inguinal hernia. Tiny mesenteric fat containing periumbilical hernia. Musculoskeletal: No acute or aggressive osseous abnormalities. Straightening and slight reversal the expected lumbar lordosis. 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 Bone island is noted within the left femoral neck. IMPRESSION: 1. The examination is positive for acute diverticulitis involving the sigmoid colon of the left lower abdomen/pelvis with associated microperforation and minimal amount of pneumoperitoneum. There is a minimal amount of adjacent ill-defined mesenteric stranding without evidence of definable/drainable fluid collection. 2. Bilateral renal cysts. Critical Value/emergent results were called by telephone at the time of interpretation on 03/02/2017 at 2:10 pm to Dr. Sherwood Gambler , who verbally acknowledged these results. Electronically Signed   By: Sandi Mariscal M.D.   On: 03/02/2017 14:12    Subjective: Feeling much better again today.  Had a couple bowel movements.  Abdominal bloating and pain have improved.  Feels ready to go home today but still concerned about what foods are safe or not safe.    Discharge Exam: Vitals:   03/04/17 2135 03/05/17 0500  BP: (!) 148/85 (!) 130/95  Pulse: 79 87  Resp: 18 18  Temp: 98.4  F (36.9 C) 98.5 F (36.9 C)   Vitals:   03/04/17 1449 03/04/17 2135 03/05/17 0500 03/05/17 0900  BP: (!) 147/89 (!) 148/85 (!) 130/95   Pulse: 80 79 87   Resp: 18 18 18    Temp: 98.7 F (37.1 C) 98.4 F (36.9 C) 98.5 F (36.9 C)   TempSrc: Oral Oral Oral   SpO2: 94% 97% 98%   Weight:    128.6 kg (283 lb 8 oz)  Height:        General exam:  Adult male.  No acute distress.  HEENT:  NCAT, MMM Respiratory system: Clear to auscultation bilaterally Cardiovascular system: Regular rate and rhythm, normal S1/S2. No murmurs, rubs, gallops or clicks.  Warm extremities Gastrointestinal system: normal active bowel sounds, mildly distended, nontender MSK:  Normal tone and bulk, no lower extremity edema Neuro:  Grossly intact.      The results of significant diagnostics from this hospitalization (including imaging, microbiology, ancillary and laboratory) are listed below for reference.     Microbiology: No results found for this or any previous visit (from the past 240 hour(s)).   Labs: BNP (last 3 results) No results for input(s): BNP in the last 8760 hours. Basic Metabolic Panel:  Recent Labs Lab 03/02/17 1138 03/03/17 0456 03/04/17 0447 03/05/17 0528  NA 140 139 139 139  K 3.1* 3.2* 3.0* 3.0*  CL 103 107 107 106  CO2 29 26 24 29   GLUCOSE 136* 149* 107* 117*  BUN 14 12 10 6   CREATININE 0.89 0.80 0.80 0.76  CALCIUM 8.5* 7.9* 8.2* 8.2*  MG  --  1.6*  --   --    Liver Function Tests:  Recent Labs Lab 03/02/17 1138  AST 22  ALT 25  ALKPHOS 110  BILITOT 1.3*  PROT 7.6  ALBUMIN 4.1    Recent Labs Lab 03/02/17 1138  LIPASE 20   No results for input(s): AMMONIA in the last 168 hours. CBC:  Recent Labs Lab 03/02/17 1138 03/03/17 0456 03/04/17 0447 03/05/17 0528  WBC 13.1* 16.9* 14.7* 10.9*  NEUTROABS 11.1*  --   --   --   HGB 14.1 12.3* 13.2 12.7*  HCT 40.9 36.8* 39.0 37.1*  MCV 85.0 86.0 86.5 83.4  PLT 179 138* 150 160   Cardiac Enzymes: No  results for input(s): CKTOTAL, CKMB, CKMBINDEX, TROPONINI in the last 168 hours. BNP: Invalid input(s): POCBNP CBG: No results for input(s): GLUCAP in the last 168 hours. D-Dimer No results for input(s): DDIMER in the last 72 hours. Hgb A1c No results for input(s): HGBA1C in the last 72 hours. Lipid Profile No results for input(s): CHOL, HDL, LDLCALC, TRIG, CHOLHDL, LDLDIRECT in the last 72 hours. Thyroid function studies No results for input(s): TSH, T4TOTAL, T3FREE, THYROIDAB in the last 72 hours.  Invalid input(s): FREET3 Anemia work up No results for input(s): VITAMINB12, FOLATE, FERRITIN, TIBC, IRON, RETICCTPCT in the last 72 hours. Urinalysis    Component Value Date/Time   COLORURINE AMBER (A) 03/02/2017 1130   APPEARANCEUR CLEAR 03/02/2017 1130   LABSPEC 1.024 03/02/2017 1130   PHURINE 7.5 03/02/2017 1130   GLUCOSEU NEGATIVE 03/02/2017 1130   HGBUR NEGATIVE 03/02/2017 1130   BILIRUBINUR NEGATIVE 03/02/2017 1130   KETONESUR NEGATIVE 03/02/2017 1130   PROTEINUR 30 (A) 03/02/2017 1130   NITRITE NEGATIVE 03/02/2017 1130   LEUKOCYTESUR NEGATIVE 03/02/2017 1130   Sepsis Labs Invalid input(s): PROCALCITONIN,  WBC,  LACTICIDVEN   Time coordinating discharge: Over 30 minutes  SIGNED:   Janece Canterbury, MD  [facility] 03/05/2017, 1:16 PM Pager   If 7PM-7AM, please contact night-coverage [email or web address] Password [email or web address]

## 2017-03-25 ENCOUNTER — Encounter: Payer: Self-pay | Admitting: Internal Medicine

## 2017-04-23 ENCOUNTER — Encounter: Payer: Self-pay | Admitting: Internal Medicine

## 2017-04-23 ENCOUNTER — Encounter (INDEPENDENT_AMBULATORY_CARE_PROVIDER_SITE_OTHER): Payer: Self-pay

## 2017-04-23 ENCOUNTER — Telehealth: Payer: Self-pay | Admitting: Internal Medicine

## 2017-04-23 ENCOUNTER — Ambulatory Visit (INDEPENDENT_AMBULATORY_CARE_PROVIDER_SITE_OTHER): Payer: No Typology Code available for payment source | Admitting: Internal Medicine

## 2017-04-23 VITALS — BP 148/86 | HR 66 | Ht 69.0 in | Wt 282.0 lb

## 2017-04-23 DIAGNOSIS — R938 Abnormal findings on diagnostic imaging of other specified body structures: Secondary | ICD-10-CM | POA: Diagnosis not present

## 2017-04-23 DIAGNOSIS — R9389 Abnormal findings on diagnostic imaging of other specified body structures: Secondary | ICD-10-CM

## 2017-04-23 DIAGNOSIS — K5732 Diverticulitis of large intestine without perforation or abscess without bleeding: Secondary | ICD-10-CM | POA: Diagnosis not present

## 2017-04-23 MED ORDER — NA SULFATE-K SULFATE-MG SULF 17.5-3.13-1.6 GM/177ML PO SOLN: 1.0000 | Freq: Once | ORAL | 0 refills | Status: AC

## 2017-04-23 NOTE — Telephone Encounter (Signed)
Spoke with pt and he is aware. 

## 2017-04-23 NOTE — Telephone Encounter (Signed)
General surgeon who performs colorectal surgery

## 2017-04-23 NOTE — Telephone Encounter (Signed)
Pt called and states the surgeon we want him to see is not in network with his insurance. Calling to find out exactly what type surgery he will need. Please advise.

## 2017-04-23 NOTE — Progress Notes (Signed)
HISTORY OF PRESENT ILLNESS:  Michael Liu is a 46 y.o. male, fundraising professional for schools, with obesity, hypertension, hyperlipidemia, and hypothyroidism who is referred today by Dr. Lang Snow with a chief complaint of recurrent diverticulitis and the need for colonoscopy. The patient reports undergoing colonoscopy 10 years ago elsewhere to evaluate Hemoccult-positive stool. By his recollection no abnormalities. He was in his usual state of health until August 2017 when he developed acute diverticulitis. This was confirmed with CT scan and uncomplicated. He was treated and recovered. Did well until March 2018 when he presented with left lower quadrant discomfort with radiation to the testicles. Discomfort was worse than his initial problems with diverticulitis. CT scan revealed acute diverticulitis in the region of the sigmoid colon with associated microperforation and minimal pneumoperitoneum. No drainable collection. He was treated with antibiotics and seen by Dr. Brigitte Pulse and follow-up 9 days later. At that time he was feeling well. He was given a prescription for Augmentin should he develop recurrent symptoms he could start this empirically and contact the office. He tells me that he was noticing vague recurrent symptoms approximately 11 days ago for which she just completed a 10 day course of antibiotics. Now feeling well. Did not have fevers. Bowel habits are regular. He did see general surgery in the hospital. Surgical follow-up recommended. Question regarding surgeons in his insurance network. In any event, sent here. He has multiple appropriate questions.  REVIEW OF SYSTEMS:  All non-GI ROS unless otherwise stated in the history of present illness low negative except for sleeping problems  Past Medical History:  Diagnosis Date  . Diverticulitis   . High cholesterol   . Hypertension   . Hypertriglyceridemia   . Hypothyroidism   . Metabolic syndrome   . Obesity (BMI 30-39.9)      History reviewed. No pertinent surgical history.  Social History Michael Liu  reports that he has never smoked. He has never used smokeless tobacco. He reports that he does not drink alcohol or use drugs.  family history includes Basal cell carcinoma in his mother; Diabetes in his sister; Hypertension in his brother, father, and sister.  Allergies  Allergen Reactions  . Percocet [Oxycodone-Acetaminophen] Other (See Comments)    Made him feel funny       PHYSICAL EXAMINATION: Vital signs: BP (!) 148/86   Pulse 66   Ht 5\' 9"  (1.753 m)   Wt 282 lb (127.9 kg)   BMI 41.64 kg/m   Constitutional: Pleasant, obese, generally well-appearing, no acute distress Psychiatric: alert and oriented x3, cooperative Eyes: extraocular movements intact, anicteric, conjunctiva pink Mouth: oral pharynx moist, no lesions Neck: supple no lymphadenopathy Cardiovascular: heart regular rate and rhythm, no murmur Lungs: clear to auscultation bilaterally Abdomen: soft, obese, nontender, nondistended, no obvious ascites, no peritoneal signs, normal bowel sounds, no organomegaly Rectal:Deferred until colonoscopy Extremities: no clubbing cyanosis or lower extremity edema bilaterally Skin: no lesions on visible extremities Neuro: No focal deficits. Normal DTRs. Cranial nerves intact  ASSESSMENT:  #1. Recurrent diverticulitis. Episode in March complicated with microperforation as described. Also, with what sounds like relapse recently for which he completed antibiotic therapy. #2. Abnormal CT scan of the abdomen and pelvis consistent with complicated diverticulitis #3. Multiple medical problems. Stable   PLAN:  #1. Discussed diverticulosis. Discussed diverticulitis both uncomplicated and complicated. Discussed the role of antibiotics, surgery, and dietary measures. Discussed the role for colonoscopy to rule out other entities masquerading as diverticulitis. Multiple questions answered to his  satisfaction. The  following recommended: -High-fiber diet. No data to support avoiding nuts or seeds -Colonoscopy to rule out other entities masquerading as diverticulitis. Unlikely, but we discussed this. Would wait several weeks to allow acute episode to clearly be resolved.The nature of the procedure, as well as the risks, benefits, and alternatives were carefully and thoroughly reviewed with the patient. Ample time for discussion and questions allowed. The patient understood, was satisfied, and agreed to proceed. -I do feel that he would be best served with elective surgery and segmental sigmoid resection given the frequent and complicated nature of his diverticular disease particular given his young age. He will reestablish with general surgery -Ongoing general medical care with Dr. Brigitte Pulse A copy of this dictation has been sent to Dr. Tonna Boehringer

## 2017-04-23 NOTE — Patient Instructions (Signed)

## 2017-05-07 ENCOUNTER — Telehealth: Payer: Self-pay | Admitting: Internal Medicine

## 2017-05-07 ENCOUNTER — Encounter: Payer: No Typology Code available for payment source | Admitting: Internal Medicine

## 2017-05-08 NOTE — Telephone Encounter (Signed)
Spoke with patient and told him I would leave a sample up front to be picked up.  Patient agreed.

## 2017-05-09 ENCOUNTER — Telehealth: Payer: Self-pay | Admitting: Internal Medicine

## 2017-05-10 NOTE — Telephone Encounter (Signed)
Message left with Dr. Donia Guiles office in [facility] phone number [phone]. Left message for them to call back.

## 2017-05-10 NOTE — Telephone Encounter (Signed)
Pt is scheduled to see Dr. Toy Cookey 06/10/17.

## 2017-05-23 ENCOUNTER — Encounter: Payer: Self-pay | Admitting: Internal Medicine

## 2017-06-05 ENCOUNTER — Encounter: Payer: Self-pay | Admitting: Internal Medicine

## 2017-06-05 ENCOUNTER — Ambulatory Visit (AMBULATORY_SURGERY_CENTER): Payer: No Typology Code available for payment source | Admitting: Internal Medicine

## 2017-06-05 VITALS — BP 144/80 | HR 65 | Temp 98.2°F | Resp 19 | Ht 69.0 in | Wt 282.0 lb

## 2017-06-05 DIAGNOSIS — R935 Abnormal findings on diagnostic imaging of other abdominal regions, including retroperitoneum: Secondary | ICD-10-CM | POA: Diagnosis not present

## 2017-06-05 DIAGNOSIS — K5730 Diverticulosis of large intestine without perforation or abscess without bleeding: Secondary | ICD-10-CM

## 2017-06-05 DIAGNOSIS — D122 Benign neoplasm of ascending colon: Secondary | ICD-10-CM | POA: Diagnosis not present

## 2017-06-05 DIAGNOSIS — K5732 Diverticulitis of large intestine without perforation or abscess without bleeding: Secondary | ICD-10-CM | POA: Diagnosis not present

## 2017-06-05 MED ORDER — SODIUM CHLORIDE 0.9 % IV SOLN: 500.0000 mL | INTRAVENOUS | Status: DC

## 2017-06-05 NOTE — Progress Notes (Signed)
#  1 Attempted rt hand unsuccessfully by Lucina Mellow, RN, #2 Rt hand unsuccessfully by Jerilee Field, #3 Left hand unsuccessfully by Rush Barer, Rn, #4 rt forearm unsuccessfully by Jerilee Field, Rn #5 left a/c unsuccessfulyl by Rush Barer, RN, #6 Right a/c unsuccessfightully by Merrily Pew Monday, CRNA, #7 right hand by [facility] Monday, CRNA

## 2017-06-05 NOTE — Patient Instructions (Signed)
Discharge instructions given. Handouts on polyps and diverticulosis. Resume previous medications. YOU HAD AN ENDOSCOPIC PROCEDURE TODAY AT THE [facility]:   Refer to the procedure report that was given to you for any specific questions about what was found during the examination.  If the procedure report does not answer your questions, please call your gastroenterologist to clarify.  If you requested that your care partner not be given the details of your procedure findings, then the procedure report has been included in a sealed envelope for you to review at your convenience later.  YOU SHOULD EXPECT: Some feelings of bloating in the abdomen. Passage of more gas than usual.  Walking can help get rid of the air that was put into your GI tract during the procedure and reduce the bloating. If you had a lower endoscopy (such as a colonoscopy or flexible sigmoidoscopy) you may notice spotting of blood in your stool or on the toilet paper. If you underwent a bowel prep for your procedure, you may not have a normal bowel movement for a few days.  Please Note:  You might notice some irritation and congestion in your nose or some drainage.  This is from the oxygen used during your procedure.  There is no need for concern and it should clear up in a day or so.  SYMPTOMS TO REPORT IMMEDIATELY:   Following lower endoscopy (colonoscopy or flexible sigmoidoscopy):  Excessive amounts of blood in the stool  Significant tenderness or worsening of abdominal pains  Swelling of the abdomen that is new, acute  Fever of 100F or higher   For urgent or emergent issues, a gastroenterologist can be reached at any hour by calling [phone].   DIET:  We do recommend a small meal at first, but then you may proceed to your regular diet.  Drink plenty of fluids but you should avoid alcoholic beverages for 24 hours.  ACTIVITY:  You should plan to take it easy for the rest of today and you should NOT  DRIVE or use heavy machinery until tomorrow (because of the sedation medicines used during the test).    FOLLOW UP: Our staff will call the number listed on your records the next business day following your procedure to check on you and address any questions or concerns that you may have regarding the information given to you following your procedure. If we do not reach you, we will leave a message.  However, if you are feeling well and you are not experiencing any problems, there is no need to return our call.  We will assume that you have returned to your regular daily activities without incident.  If any biopsies were taken you will be contacted by phone or by letter within the next 1-3 weeks.  Please call us at [phone] if you have not heard about the biopsies in 3 weeks.    SIGNATURES/CONFIDENTIALITY: You and/or your care partner have signed paperwork which will be entered into your electronic medical record.  These signatures attest to the fact that that the information above on your After Visit Summary has been reviewed and is understood.  Full responsibility of the confidentiality of this discharge information lies with you and/or your care-partner. 

## 2017-06-05 NOTE — Progress Notes (Signed)
Called to room to assist during endoscopic procedure.  Patient ID and intended procedure confirmed with present staff. Received instructions for my participation in the procedure from the performing physician.  

## 2017-06-05 NOTE — Progress Notes (Signed)
Report to PACU, RN, vss, BBS= Clear.  

## 2017-06-05 NOTE — Op Note (Signed)
[facility] Patient Name: Michael Liu Procedure Date: 06/05/2017 11:40 AM MRN: [id number] Endoscopist: Docia Chuck. Henrene Pastor , MD Age: 46 Referring MD:  Date of Birth: 10-12-1971 Gender: Male Account #: [id number] Procedure:                Colonoscopy, with cold snare polypectomy x 1 Indications:              Abnormal CT of the GI tract. Recurrent                            diverticulitis with microperforation. Anticipating                            surgery Medicines:                Monitored Anesthesia Care Procedure:                Pre-Anesthesia Assessment:                           - Prior to the procedure, a History and Physical                            was performed, and patient medications and                            allergies were reviewed. The patient's tolerance of                            previous anesthesia was also reviewed. The risks                            and benefits of the procedure and the sedation                            options and risks were discussed with the patient.                            All questions were answered, and informed consent                            was obtained. Prior Anticoagulants: The patient has                            taken no previous anticoagulant or antiplatelet                            agents. ASA Grade Assessment: II - A patient with                            mild systemic disease. After reviewing the risks                            and benefits, the patient was deemed in  satisfactory condition to undergo the procedure.                           After obtaining informed consent, the colonoscope                            was passed under direct vision. Throughout the                            procedure, the patient's blood pressure, pulse, and                            oxygen saturations were monitored continuously. The                            Colonoscope was introduced through  the anus and                            advanced to the the cecum, identified by                            appendiceal orifice and ileocecal valve. The                            ileocecal valve, appendiceal orifice, and rectum                            were photographed. The quality of the bowel                            preparation was excellent. The colonoscopy was                            performed without difficulty. The patient tolerated                            the procedure well. The bowel preparation used was                            SUPREP. Scope In: 12:01:54 PM Scope Out: 12:13:55 PM Scope Withdrawal Time: 0 hours 9 minutes 14 seconds  Total Procedure Duration: 0 hours 12 minutes 1 second  Findings:                 A 2 mm polyp was found in the ascending colon. The                            polyp was removed with a cold snare. Resection and                            retrieval were complete.                           Multiple small and large-mouthed diverticula were  found in the left colon.                           The exam was otherwise without abnormality on                            direct and retroflexion views. Complications:            No immediate complications. Estimated blood loss:                            None. Estimated Blood Loss:     Estimated blood loss: none. Impression:               - One 2 mm polyp in the ascending colon, removed                            with a cold snare. Resected and retrieved.                           - Diverticulosis in the left colon.                           - The examination was otherwise normal on direct                            and retroflexion views. Recommendation:           - Repeat colonoscopy in 5-10 years for surveillance.                           - Patient has a contact number available for                            emergencies. The signs and symptoms of potential                             delayed complications were discussed with the                            patient. Return to normal activities tomorrow.                            Written discharge instructions were provided to the                            patient.                           - Resume previous diet.                           - Continue present medications.                           - Await pathology results. Docia Chuck. Henrene Pastor, MD 06/05/2017 12:19:27 PM This report has been signed electronically.

## 2017-06-06 ENCOUNTER — Telehealth: Payer: Self-pay

## 2017-06-06 NOTE — Telephone Encounter (Signed)
  Follow up Call-  Call back number 06/05/2017  Post procedure Call Back phone  # [phone] cell  Permission to leave phone message Yes  Some recent data might be hidden     Patient questions:  Do you have a fever, pain , or abdominal swelling? No. Pain Score  0 *  Have you tolerated food without any problems? Yes.    Have you been able to return to your normal activities? Yes.    Do you have any questions about your discharge instructions: Diet   No. Medications  No. Follow up visit  No.  Do you have questions or concerns about your Care? No.  Actions: * If pain score is 4 or above: No action needed, pain <4.

## 2017-06-06 NOTE — Telephone Encounter (Signed)
Left message on answering machine. 

## 2017-06-12 ENCOUNTER — Encounter: Payer: Self-pay | Admitting: Internal Medicine

## 2019-03-12 DIAGNOSIS — R0683 Snoring: Secondary | ICD-10-CM | POA: Diagnosis not present

## 2019-03-12 DIAGNOSIS — E038 Other specified hypothyroidism: Secondary | ICD-10-CM | POA: Diagnosis not present

## 2019-03-12 DIAGNOSIS — E876 Hypokalemia: Secondary | ICD-10-CM | POA: Diagnosis not present

## 2019-03-12 DIAGNOSIS — I10 Essential (primary) hypertension: Secondary | ICD-10-CM | POA: Diagnosis not present

## 2019-03-19 ENCOUNTER — Other Ambulatory Visit: Payer: Self-pay | Admitting: Physician Assistant

## 2019-03-19 DIAGNOSIS — D229 Melanocytic nevi, unspecified: Secondary | ICD-10-CM | POA: Diagnosis not present

## 2019-03-19 DIAGNOSIS — D485 Neoplasm of uncertain behavior of skin: Secondary | ICD-10-CM | POA: Diagnosis not present

## 2019-03-19 DIAGNOSIS — L918 Other hypertrophic disorders of the skin: Secondary | ICD-10-CM | POA: Diagnosis not present

## 2019-03-26 DIAGNOSIS — E876 Hypokalemia: Secondary | ICD-10-CM | POA: Diagnosis not present

## 2019-03-26 DIAGNOSIS — R600 Localized edema: Secondary | ICD-10-CM | POA: Diagnosis not present

## 2019-03-26 DIAGNOSIS — I10 Essential (primary) hypertension: Secondary | ICD-10-CM | POA: Diagnosis not present

## 2019-05-28 DIAGNOSIS — Z0000 Encounter for general adult medical examination without abnormal findings: Secondary | ICD-10-CM | POA: Diagnosis not present

## 2019-05-28 DIAGNOSIS — R7301 Impaired fasting glucose: Secondary | ICD-10-CM | POA: Diagnosis not present

## 2019-05-28 DIAGNOSIS — E038 Other specified hypothyroidism: Secondary | ICD-10-CM | POA: Diagnosis not present

## 2019-05-28 DIAGNOSIS — R82998 Other abnormal findings in urine: Secondary | ICD-10-CM | POA: Diagnosis not present

## 2019-05-28 DIAGNOSIS — Z125 Encounter for screening for malignant neoplasm of prostate: Secondary | ICD-10-CM | POA: Diagnosis not present

## 2019-05-28 DIAGNOSIS — I10 Essential (primary) hypertension: Secondary | ICD-10-CM | POA: Diagnosis not present

## 2019-06-04 DIAGNOSIS — E876 Hypokalemia: Secondary | ICD-10-CM | POA: Diagnosis not present

## 2019-06-04 DIAGNOSIS — I10 Essential (primary) hypertension: Secondary | ICD-10-CM | POA: Diagnosis not present

## 2019-06-04 DIAGNOSIS — Z1331 Encounter for screening for depression: Secondary | ICD-10-CM | POA: Diagnosis not present

## 2019-06-04 DIAGNOSIS — E785 Hyperlipidemia, unspecified: Secondary | ICD-10-CM | POA: Diagnosis not present

## 2019-06-04 DIAGNOSIS — Z0000 Encounter for general adult medical examination without abnormal findings: Secondary | ICD-10-CM | POA: Diagnosis not present

## 2019-06-04 DIAGNOSIS — E039 Hypothyroidism, unspecified: Secondary | ICD-10-CM | POA: Diagnosis not present

## 2019-07-16 DIAGNOSIS — L820 Inflamed seborrheic keratosis: Secondary | ICD-10-CM | POA: Diagnosis not present

## 2019-07-16 DIAGNOSIS — D225 Melanocytic nevi of trunk: Secondary | ICD-10-CM | POA: Diagnosis not present

## 2019-07-16 DIAGNOSIS — B078 Other viral warts: Secondary | ICD-10-CM | POA: Diagnosis not present

## 2019-07-16 DIAGNOSIS — D485 Neoplasm of uncertain behavior of skin: Secondary | ICD-10-CM | POA: Diagnosis not present

## 2019-07-29 DIAGNOSIS — R7301 Impaired fasting glucose: Secondary | ICD-10-CM | POA: Diagnosis not present

## 2019-07-29 DIAGNOSIS — I10 Essential (primary) hypertension: Secondary | ICD-10-CM | POA: Diagnosis not present

## 2020-05-09 DIAGNOSIS — H1045 Other chronic allergic conjunctivitis: Secondary | ICD-10-CM | POA: Diagnosis not present

## 2020-06-03 ENCOUNTER — Encounter: Payer: Self-pay | Admitting: Emergency Medicine

## 2020-06-03 ENCOUNTER — Emergency Department
Admission: EM | Admit: 2020-06-03 | Discharge: 2020-06-03 | Disposition: A | Discharge status: Home / Self Care | Payer: BC Managed Care – PPO | Source: Home / Self Care | Attending: Family Medicine | Admitting: Family Medicine

## 2020-06-03 ENCOUNTER — Other Ambulatory Visit: Payer: Self-pay

## 2020-06-03 DIAGNOSIS — J029 Acute pharyngitis, unspecified: Secondary | ICD-10-CM | POA: Diagnosis not present

## 2020-06-03 LAB — POCT RAPID STREP A (OFFICE): Rapid Strep A Screen: NEGATIVE

## 2020-06-03 NOTE — Discharge Instructions (Addendum)
Try warm salt water gargles for sore throat.  May take Ibuprofen 200mg , 4 tabs every 8 hours with food for sore throat   If cold-like symptoms develop, try the following: Take plain guaifenesin (1200mg  extended release tabs such as Mucinex) twice daily, with plenty of water, for cough and congestion.  Get adequate rest.   May use Afrin nasal spray (or generic oxymetazoline) each morning for about 5 days and then discontinue.  Also recommend using saline nasal spray several times daily and saline nasal irrigation (AYR is a common brand).  Use Flonase nasal spray each morning after using Afrin nasal spray and saline nasal irrigation. Stop all antihistamines for now, and other non-prescription cough/cold preparations. May take Delsym Cough Suppressant at bedtime for nighttime cough.

## 2020-06-03 NOTE — ED Provider Notes (Signed)
[facility]    CSN: [id number] Arrival date & time: 06/03/20  0815      History   Chief Complaint Chief Complaint  Patient presents with  . Sore Throat  . Generalized Body Aches    HPI Michael Liu is a 49 y.o. male.   Last night patient became fatigued, and this morning awoke with a sore throat, myalgias, and occasional post-nasal drainage.  He denies nasal congestion, cough, fevers, chills, and sweats.  The history is provided by the patient.    Past Medical History:  Diagnosis Date  . Diverticulitis   . High cholesterol   . Hypertension   . Hypertriglyceridemia   . Hypothyroidism   . Metabolic syndrome   . Obesity (BMI 30-39.9)     Patient Active Problem List   Diagnosis Date Noted  . Diverticulitis of colon with perforation 03/02/2017  . Hypokalemia 03/02/2017  . Obesity (BMI 30-39.9)   . Hypothyroidism   . Hypertension   . High cholesterol   . BMI 50.0-59.9, adult ([place]) 07/19/2013    Past Surgical History:  Procedure Laterality Date  . COLONOSCOPY    . TONSILLECTOMY    . WISDOM TOOTH EXTRACTION         Home Medications    Prior to Admission medications   Medication Sig Start Date End Date Taking? Authorizing Provider  atorvastatin (LIPITOR) 20 MG tablet Take 20 mg by mouth every morning. 08/02/16  Yes [provider]  levothyroxine (SYNTHROID, LEVOTHROID) 75 MCG tablet Take 75 mcg by mouth daily before breakfast.   Yes [provider]  metoprolol succinate (TOPROL-XL) 100 MG 24 hr tablet Take 100 mg by mouth daily. 06/19/16  Yes [provider]  potassium chloride SA (K-DUR,KLOR-CON) 20 MEQ tablet Take 20 mEq by mouth every morning.    Yes [provider]  zolpidem (AMBIEN CR) 12.5 MG CR tablet Take 12.5 mg by mouth at bedtime.    Yes [provider]  amLODipine (NORVASC) 5 MG tablet Take 5 mg by mouth daily. 07/01/16   [provider]  chlorthalidone (HYGROTON) 50 MG tablet Take  100 mg by mouth daily. 03/29/20   [provider]  Probiotic Product (PROBIOTIC DAILY PO) Take 1 capsule by mouth daily.    [provider]  valsartan (DIOVAN) 320 MG tablet Take 320 mg by mouth daily. 02/27/20   [provider]    Family History Family History  Problem Relation Age of Onset  . Basal cell carcinoma Mother   . Hypertension Father   . Hypertension Sister   . Diabetes Sister   . Hypertension Brother   . Cancer Neg Hx   . Colon cancer Neg Hx   . Esophageal cancer Neg Hx   . Pancreatic cancer Neg Hx   . Prostate cancer Neg Hx   . Rectal cancer Neg Hx   . Stomach cancer Neg Hx     Social History Social History   Tobacco Use  . Smoking status: Never Smoker  . Smokeless tobacco: Never Used  Vaping Use  . Vaping Use: Never used  Substance Use Topics  . Alcohol use: No  . Drug use: No     Allergies   Codeine and Percocet [oxycodone-acetaminophen]   Review of Systems Review of Systems + sore throat No cough No pleuritic pain No wheezing No nasal congestion ? post-nasal drainage No sinus pain/pressure No itchy/red eyes No earache No hemoptysis No SOB No fever/chills No nausea No vomiting No  abdominal pain No diarrhea No urinary symptoms No skin rash + fatigue + mild myalgias No headache   Physical Exam Triage Vital Signs ED Triage Vitals  Enc Vitals Group     BP 06/03/20 0832 134/82     Pulse Rate 06/03/20 0832 71     Resp 06/03/20 0832 16     Temp 06/03/20 0832 98.2 F (36.8 C)     Temp Source 06/03/20 0832 Oral     SpO2 06/03/20 0832 96 %     Weight --      Height --      Head Circumference --      Peak Flow --      Pain Score 06/03/20 0833 4     Pain Loc --      Pain Edu? --      Excl. in [place]? --    No data found.  Updated Vital Signs BP 134/82 (BP Location: Right Arm)   Pulse 71   Temp 98.2 F (36.8 C) (Oral)   Resp 16   SpO2 96%   Visual Acuity Right Eye Distance:   Left Eye Distance:     Bilateral Distance:    Right Eye Near:   Left Eye Near:    Bilateral Near:     Physical Exam Nursing notes and Vital Signs reviewed. Appearance:  Patient appears stated age, and in no acute distress Eyes:  Pupils are equal, round, and reactive to light and accomodation.  Extraocular movement is intact.  Conjunctivae are not inflamed  Ears:  Canals normal.  Tympanic membranes normal.  Nose:  Congested turbinates.  No sinus tenderness.  Pharynx:  Mildly erythematous uvula Neck:  Supple.  Mildly enlarged lateral nodes are present, tender to palpation on the left.   Lungs:  Clear to auscultation.  Breath sounds are equal.  Moving air well. Heart:  Regular rate and rhythm without murmurs, rubs, or gallops.  Abdomen:  Nontender without masses or hepatosplenomegaly.  Bowel sounds are present.  No CVA or flank tenderness.  Extremities:  No edema.  Skin:  No rash present.   UC Treatments / Results  Labs (all labs ordered are listed, but only abnormal results are displayed) Labs Reviewed  POCT RAPID STREP A (OFFICE) negative    EKG   Radiology No results found.  Procedures Procedures (including critical care time)  Medications Ordered in UC Medications - No data to display  Initial Impression / Assessment and Plan / UC Course  I have reviewed the triage vital signs and the nursing notes.  Pertinent labs & imaging results that were available during my care of the patient were reviewed by me and considered in my medical decision making (see chart for details).    Benign exam; suspect early viral URI.  Throat culture pending. Treat symptomatically for now. Followup with Family Doctor if not improved in one week.    Final Clinical Impressions(s) / UC Diagnoses   Final diagnoses:  Acute pharyngitis, unspecified etiology     Discharge Instructions     Try warm salt water gargles for sore throat.  May take Ibuprofen 200mg , 4 tabs every 8 hours with food for sore  throat   If cold-like symptoms develop, try the following: Take plain guaifenesin (1200mg  extended release tabs such as Mucinex) twice daily, with plenty of water, for cough and congestion.  Get adequate rest.   May use Afrin nasal spray (or generic oxymetazoline) each morning for about 5 days and then discontinue.  Also recommend using saline nasal spray several times daily and saline nasal irrigation (AYR is a common brand).  Use Flonase nasal spray each morning after using Afrin nasal spray and saline nasal irrigation. Stop all antihistamines for now, and other non-prescription cough/cold preparations. May take Delsym Cough Suppressant at bedtime for nighttime cough.     ED Prescriptions    None        Kandra Nicolas, MD 06/03/20 [phone]

## 2020-06-03 NOTE — ED Triage Notes (Signed)
Sore throat since last night  Body aches this am  Hurts to swallow No OTC meds Denies seasonal allergies COVID Moderna (April 20th  2nd shot)

## 2020-06-05 LAB — STREP A DNA PROBE: Group A Strep Probe: NOT DETECTED

## 2020-06-06 ENCOUNTER — Emergency Department
Admission: EM | Admit: 2020-06-06 | Discharge: 2020-06-06 | Disposition: A | Discharge status: Home / Self Care | Payer: BC Managed Care – PPO | Source: Home / Self Care

## 2020-06-06 ENCOUNTER — Other Ambulatory Visit: Payer: Self-pay

## 2020-06-06 DIAGNOSIS — J029 Acute pharyngitis, unspecified: Secondary | ICD-10-CM | POA: Diagnosis not present

## 2020-06-06 DIAGNOSIS — J069 Acute upper respiratory infection, unspecified: Secondary | ICD-10-CM

## 2020-06-06 MED ORDER — DEXAMETHASONE SODIUM PHOSPHATE 10 MG/ML IJ SOLN
10.0000 mg | Freq: Once | INTRAMUSCULAR | Status: AC
  Administered 2020-06-06: 10 mg via INTRAMUSCULAR

## 2020-06-06 MED ORDER — PREDNISONE 20 MG PO TABS: 20.0000 mg | ORAL_TABLET | Freq: Two times a day (BID) | ORAL | 0 refills | Status: AC

## 2020-06-06 NOTE — ED Triage Notes (Signed)
Patient presents to Urgent Care with complaints of continued sore throat since last week. Patient reports he was tested for strep last week and it was negative, reports new pain in bilateral ears. Also has a slight cough, denies fever, has had covid shot. Pt has been taking advil and tried something for congestion that is "okay if you have high blood pressure".

## 2020-06-06 NOTE — ED Provider Notes (Signed)
[facility]    CSN: [id number] Arrival date & time: 06/06/20  1843      History   Chief Complaint Chief Complaint  Patient presents with  . Nasal Congestion    HPI Michael Liu is a 49 y.o. male.   HPI  Michael Liu is a 49 y.o. male presenting to UC with c/o worsening sore throat and nasal congestion, associated fatigue.  He was seen at [facility] on 06/03/20, tested negative for strep throat.  He has taken Advil and Coricidin for congestion with mild relief. Denies fever, chills, n/v/d. No chest pain or SOB. No sick contacts or recent travel.  He has received the Covid-19 vaccine.    Past Medical History:  Diagnosis Date  . Diverticulitis   . High cholesterol   . Hypertension   . Hypertriglyceridemia   . Hypothyroidism   . Metabolic syndrome   . Obesity (BMI 30-39.9)     Patient Active Problem List   Diagnosis Date Noted  . Diverticulitis of colon with perforation 03/02/2017  . Hypokalemia 03/02/2017  . Obesity (BMI 30-39.9)   . Hypothyroidism   . Hypertension   . High cholesterol   . BMI 50.0-59.9, adult ([place]) 07/19/2013    Past Surgical History:  Procedure Laterality Date  . COLONOSCOPY    . TONSILLECTOMY    . WISDOM TOOTH EXTRACTION         Home Medications    Prior to Admission medications   Medication Sig Start Date End Date Taking? Authorizing Provider  amLODipine (NORVASC) 5 MG tablet Take 5 mg by mouth daily. 07/01/16   [provider]  atorvastatin (LIPITOR) 20 MG tablet Take 20 mg by mouth every morning. 08/02/16   [provider]  chlorthalidone (HYGROTON) 50 MG tablet Take 100 mg by mouth daily. 03/29/20   [provider]  levothyroxine (SYNTHROID, LEVOTHROID) 75 MCG tablet Take 75 mcg by mouth daily before breakfast.    [provider]  metoprolol succinate (TOPROL-XL) 100 MG 24 hr tablet Take 100 mg by mouth daily. 06/19/16   [provider]  potassium chloride SA (K-DUR,KLOR-CON) 20 MEQ  tablet Take 20 mEq by mouth every morning.     [provider]  predniSONE (DELTASONE) 20 MG tablet Take 1 tablet (20 mg total) by mouth 2 (two) times daily with a meal for 4 days. 06/06/20 06/10/20  Noe Gens, PA-C  Probiotic Product (PROBIOTIC DAILY PO) Take 1 capsule by mouth daily.    [provider]  valsartan (DIOVAN) 320 MG tablet Take 320 mg by mouth daily. 02/27/20   [provider]  zolpidem (AMBIEN CR) 12.5 MG CR tablet Take 12.5 mg by mouth at bedtime.     [provider]    Family History Family History  Problem Relation Age of Onset  . Basal cell carcinoma Mother   . Hypertension Father   . Hypertension Sister   . Diabetes Sister   . Hypertension Brother   . Cancer Neg Hx   . Colon cancer Neg Hx   . Esophageal cancer Neg Hx   . Pancreatic cancer Neg Hx   . Prostate cancer Neg Hx   . Rectal cancer Neg Hx   . Stomach cancer Neg Hx     Social History Social History   Tobacco Use  . Smoking status: Never Smoker  . Smokeless tobacco: Never Used  Vaping Use  . Vaping Use: Never used  Substance Use Topics  . Alcohol use:  No  . Drug use: No     Allergies   Codeine and Percocet [oxycodone-acetaminophen]   Review of Systems Review of Systems  Constitutional: Negative for chills and fever.  HENT: Positive for congestion, postnasal drip, rhinorrhea and sore throat. Negative for ear pain, trouble swallowing and voice change.   Respiratory: Positive for cough ( minimal). Negative for shortness of breath.   Cardiovascular: Negative for chest pain and palpitations.  Gastrointestinal: Negative for abdominal pain, diarrhea, nausea and vomiting.  Musculoskeletal: Negative for arthralgias, back pain and myalgias.  Skin: Negative for rash.  Neurological: Positive for headaches. Negative for dizziness and light-headedness.  All other systems reviewed and are negative.    Physical Exam Triage Vital Signs ED Triage Vitals  Enc  Vitals Group     BP 06/06/20 1855 (!) 159/86     Pulse Rate 06/06/20 1855 61     Resp 06/06/20 1855 18     Temp 06/06/20 1855 98.8 F (37.1 C)     Temp Source 06/06/20 1855 Oral     SpO2 06/06/20 1855 97 %     Weight --      Height --      Head Circumference --      Peak Flow --      Pain Score 06/06/20 1853 8     Pain Loc --      Pain Edu? --      Excl. in [place]? --    No data found.  Updated Vital Signs BP (!) 159/86 (BP Location: Left Arm)   Pulse 61   Temp 98.8 F (37.1 C) (Oral)   Resp 18   SpO2 97%   Visual Acuity Right Eye Distance:   Left Eye Distance:   Bilateral Distance:    Right Eye Near:   Left Eye Near:    Bilateral Near:     Physical Exam Vitals and nursing note reviewed.  Constitutional:      General: He is not in acute distress.    Appearance: Normal appearance. He is well-developed. He is not ill-appearing, toxic-appearing or diaphoretic.  HENT:     Head: Normocephalic and atraumatic.     Right Ear: Tympanic membrane and ear canal normal.     Left Ear: Tympanic membrane and ear canal normal.     Nose: Nose normal.     Right Sinus: No maxillary sinus tenderness or frontal sinus tenderness.     Left Sinus: No maxillary sinus tenderness or frontal sinus tenderness.     Mouth/Throat:     Lips: Pink.     Mouth: Mucous membranes are moist.     Pharynx: Oropharynx is clear. Uvula midline. Posterior oropharyngeal erythema present. No pharyngeal swelling, oropharyngeal exudate or uvula swelling.     Tonsils: No tonsillar exudate or tonsillar abscesses.  Neck:     Vascular: No carotid bruit.  Cardiovascular:     Rate and Rhythm: Normal rate and regular rhythm.  Pulmonary:     Effort: Pulmonary effort is normal. No respiratory distress.     Breath sounds: Normal breath sounds. No stridor. No wheezing, rhonchi or rales.  Musculoskeletal:        General: Normal range of motion.     Cervical back: Normal range of motion and neck supple. No rigidity or  tenderness.  Lymphadenopathy:     Cervical: Cervical adenopathy present.  Skin:    General: Skin is warm and dry.  Neurological:     Mental Status: He is alert  and oriented to person, place, and time.  Psychiatric:        Behavior: Behavior normal.      UC Treatments / Results  Labs (all labs ordered are listed, but only abnormal results are displayed) Labs Reviewed - No data to display  EKG   Radiology No results found.  Procedures Procedures (including critical care time)  Medications Ordered in UC Medications  dexamethasone (DECADRON) injection 10 mg (10 mg Intramuscular Given 06/06/20 1919)    Initial Impression / Assessment and Plan / UC Course  I have reviewed the triage vital signs and the nursing notes.  Pertinent labs & imaging results that were available during my care of the patient were reviewed by me and considered in my medical decision making (see chart for details).     No evidence of tonsillar abscess Pt had a negative strep culture Hx and exam still c/w viral illness Will tx with decadron 10mg  IM Oral prednisone also sent to pharmacy Encouraged f/u with PCP if not improving in 1 week AVS provided  Final Clinical Impressions(s) / UC Diagnoses   Final diagnoses:  Acute pharyngitis, unspecified etiology  Viral upper respiratory illness     Discharge Instructions     You may take 500mg  acetaminophen every 4-6 hours or in combination with ibuprofen 400-600mg  every 6-8 hours as needed for pain, inflammation, and fever.  Be sure to well hydrated with clear liquids and get at least 8 hours of sleep at night, preferably more while sick.   Please follow up with family medicine in 1 week if needed.     ED Prescriptions    Medication Sig Dispense Auth. Provider   predniSONE (DELTASONE) 20 MG tablet Take 1 tablet (20 mg total) by mouth 2 (two) times daily with a meal for 4 days. 8 tablet Noe Gens, [place]     PDMP not reviewed this  encounter.   Noe Gens, [place] 06/06/20 1920

## 2020-06-06 NOTE — Discharge Instructions (Signed)
  You may take 500mg acetaminophen every 4-6 hours or in combination with ibuprofen 400-600mg every 6-8 hours as needed for pain, inflammation, and fever.  Be sure to well hydrated with clear liquids and get at least 8 hours of sleep at night, preferably more while sick.   Please follow up with family medicine in 1 week if needed.   

## 2020-06-24 DIAGNOSIS — R7301 Impaired fasting glucose: Secondary | ICD-10-CM | POA: Diagnosis not present

## 2020-06-24 DIAGNOSIS — I10 Essential (primary) hypertension: Secondary | ICD-10-CM | POA: Diagnosis not present

## 2020-06-24 DIAGNOSIS — K921 Melena: Secondary | ICD-10-CM | POA: Diagnosis not present

## 2020-06-24 DIAGNOSIS — E038 Other specified hypothyroidism: Secondary | ICD-10-CM | POA: Diagnosis not present

## 2020-06-24 DIAGNOSIS — E7849 Other hyperlipidemia: Secondary | ICD-10-CM | POA: Diagnosis not present

## 2020-06-24 DIAGNOSIS — R82998 Other abnormal findings in urine: Secondary | ICD-10-CM | POA: Diagnosis not present

## 2020-06-24 DIAGNOSIS — Z125 Encounter for screening for malignant neoplasm of prostate: Secondary | ICD-10-CM | POA: Diagnosis not present

## 2020-06-30 DIAGNOSIS — R7301 Impaired fasting glucose: Secondary | ICD-10-CM | POA: Diagnosis not present

## 2020-06-30 DIAGNOSIS — Z1331 Encounter for screening for depression: Secondary | ICD-10-CM | POA: Diagnosis not present

## 2020-06-30 DIAGNOSIS — E781 Pure hyperglyceridemia: Secondary | ICD-10-CM | POA: Diagnosis not present

## 2020-06-30 DIAGNOSIS — Z0000 Encounter for general adult medical examination without abnormal findings: Secondary | ICD-10-CM | POA: Diagnosis not present

## 2020-06-30 DIAGNOSIS — I10 Essential (primary) hypertension: Secondary | ICD-10-CM | POA: Diagnosis not present

## 2020-06-30 DIAGNOSIS — E785 Hyperlipidemia, unspecified: Secondary | ICD-10-CM | POA: Diagnosis not present

## 2020-07-15 DIAGNOSIS — R748 Abnormal levels of other serum enzymes: Secondary | ICD-10-CM | POA: Diagnosis not present

## 2020-07-29 DIAGNOSIS — B078 Other viral warts: Secondary | ICD-10-CM | POA: Diagnosis not present

## 2020-07-29 DIAGNOSIS — L918 Other hypertrophic disorders of the skin: Secondary | ICD-10-CM | POA: Diagnosis not present

## 2020-08-25 ENCOUNTER — Institutional Professional Consult (permissible substitution): Payer: PRIVATE HEALTH INSURANCE | Admitting: Neurology

## 2020-09-20 ENCOUNTER — Ambulatory Visit: Payer: BC Managed Care – PPO | Admitting: Neurology

## 2020-09-20 ENCOUNTER — Encounter: Payer: Self-pay | Admitting: Neurology

## 2020-09-20 VITALS — BP 142/94 | HR 66 | Ht 69.0 in | Wt 311.0 lb

## 2020-09-20 DIAGNOSIS — G478 Other sleep disorders: Secondary | ICD-10-CM | POA: Insufficient documentation

## 2020-09-20 DIAGNOSIS — Z6841 Body Mass Index (BMI) 40.0 and over, adult: Secondary | ICD-10-CM | POA: Insufficient documentation

## 2020-09-20 DIAGNOSIS — R0681 Apnea, not elsewhere classified: Secondary | ICD-10-CM | POA: Diagnosis not present

## 2020-09-20 DIAGNOSIS — R0683 Snoring: Secondary | ICD-10-CM

## 2020-09-20 NOTE — Progress Notes (Signed)
SLEEP MEDICINE CLINIC    Provider:  Larey Seat, MD  Primary Care Physician:  Marton Redwood, MD [place] [address]     Referring Provider: Marton Redwood, [place] [place],  [place] [address]          Chief Complaint according to patient   Patient presents with:     New Patient (Initial Visit)     he would wake up spontaneously/frequently during the night. pt had been having this issue until recently he changed diet and started drinking nothing but water and stopped eating after 7 pm and since doing this he is sleeping better and feeling better.       HISTORY OF PRESENT ILLNESS:  Michael Liu is a 49 - year- old Caucasian male patient and seen here upon a  referral  for consultation on 09/20/2020. Referred b Dr Brigitte Pulse.  Chief concern according to patient : " I am more tired, I use Ambien to sleep, and I gained weight".    I have the pleasure of seeing Michael Liu today, a right -handed  male patient with a possible sleep disorder and he  has a past medical history of Diverticulitis, High cholesterol, Hypertension, Hypertriglyceridemia, Hypothyroidism, Insomnia, Metabolic syndrome, Nephrolithiasis, and Morbid Obesity (BMI 45). A previous sleep study in 2005(?) yielded no results.    Sleep relevant medical history: Nocturia once- some snoring.    Family medical /sleep history: No other family member with OSA, insomnia, sleep walkers.    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  He lives in a household with spouse and children. The patient currently works in daytime, 7-5 pm. No pets are  present. Tobacco use, never .  ETOH use; rare,  Caffeine intake in form of Coffee( 2 a day - but stopped 14 days ago. Stopped eating after 7 Pm.  Regular exercise - no yet.   Hobbies : football , Development worker, international aid.       Sleep habits are as follows: The patient's dinner time is between 5-6  PM. The patient goes to bed at 10 PM  and continues to sleep for 3-4 hours, wakes for  bathroom breaks, the first time at 2 AM.  Bedroom is cool, dark and quiet- The preferred sleep position is on his right side, with the support of 1 pillow.  Dreams are reportedly infrequent.  7 AM is the usual rise time. The patient wakes up spontaneously/b 5.30 and stays in bed.   He reports not feeling refreshed or restored in AM, but this improved over the last 14 days.  He often had woken with symptoms such as residual fatigue.  Naps are taken infrequently,'never been a napper" .   Review of Systems: Out of a complete 14 system review, the patient complains of only the following symptoms, and all other reviewed systems are negative.:  Fatigue, sleepiness , wife reports apnea and snoring, fragmented sleep, Insomnia - improving. But still on ambien.   How likely are you to doze in the following situations: 0 = not likely, 1 = slight chance, 2 = moderate chance, 3 = high chance   Sitting and Reading? Watching Television? Sitting inactive in a public place (theater or meeting)? As a passenger in a car for an hour without a break? Lying down in the afternoon when circumstances permit? Sitting and talking to someone? Sitting quietly after lunch without alcohol? In a car, while stopped for a  few minutes in traffic?   Total = 9/ 24 points   FSS endorsed at 20/ 63 points.   Social History   Socioeconomic History   Marital status: Married    Spouse name: Not on file   Number of children: Not on file   Years of education: Not on file   Highest education level: Not on file  Occupational History   Occupation: fundraiser for schools  Tobacco Use   Smoking status: Never Smoker   Smokeless tobacco: Never Used  Scientific laboratory technician Use: Never used  Substance and Sexual Activity   Alcohol use: No   Drug use: No   Sexual activity: Not on file  Other Topics Concern   Not on file  Social History Narrative   Not on file    Social Determinants of Health   Financial Resource Strain:    Difficulty of Paying Living Expenses: Not on file  Food Insecurity:    Worried About [place] in the Last Year: Not on file   [place] in the Last Year: Not on file  Transportation Needs:    Lack of Transportation (Medical): Not on file   Lack of Transportation (Non-Medical): Not on file  Physical Activity:    Days of Exercise per Week: Not on file   Minutes of Exercise per Session: Not on file  Stress:    Feeling of Stress : Not on file  Social Connections:    Frequency of Communication with Friends and Family: Not on file   Frequency of Social Gatherings with Friends and Family: Not on file   Attends Religious Services: Not on file   Active Member of Clubs or Organizations: Not on file   Attends Archivist Meetings: Not on file   Marital Status: Not on file    Family History  Problem Relation Age of Onset   Basal cell carcinoma Mother    Hypertension Father    Hypertension Sister    Diabetes Sister    Hypertension Brother    Cancer Neg Hx    Colon cancer Neg Hx    Esophageal cancer Neg Hx    Pancreatic cancer Neg Hx    Prostate cancer Neg Hx    Rectal cancer Neg Hx    Stomach cancer Neg Hx     Past Medical History:  Diagnosis Date   Diverticulitis    High cholesterol    Hypertension    Hypertriglyceridemia    Hypothyroidism    Insomnia    Metabolic syndrome    Nephrolithiasis    Obesity (BMI 30-39.9)     Past Surgical History:  Procedure Laterality Date   COLONOSCOPY     TONSILLECTOMY     WISDOM TOOTH EXTRACTION       Current Outpatient Medications on File Prior to Visit  Medication Sig Dispense Refill   amLODipine (NORVASC) 5 MG tablet Take 5 mg by mouth daily.     atorvastatin (LIPITOR) 20 MG tablet Take 20 mg by mouth every morning.     chlorthalidone (HYGROTON) 50 MG tablet Take 100 mg by mouth daily.      levothyroxine (SYNTHROID, LEVOTHROID) 75 MCG tablet Take 75 mcg by mouth daily before breakfast.     metoprolol succinate (TOPROL-XL) 100 MG 24 hr tablet Take 100 mg by mouth daily.     potassium chloride SA (K-DUR,KLOR-CON) 20 MEQ tablet Take 20 mEq by mouth every morning.      Probiotic Product (  PROBIOTIC DAILY PO) Take 1 capsule by mouth daily.     valsartan (DIOVAN) 320 MG tablet Take 320 mg by mouth daily.     zolpidem (AMBIEN CR) 12.5 MG CR tablet Take 12.5 mg by mouth at bedtime.      Current Facility-Administered Medications on File Prior to Visit  Medication Dose Route Frequency Provider Last Rate Last Admin   0.9 %  sodium chloride infusion  500 mL Intravenous Continuous Irene Shipper, MD        Allergies  Allergen Reactions   Codeine Other (See Comments)   Percocet [Oxycodone-Acetaminophen] Other (See Comments)    Made him feel funny    Physical exam:  Today's Vitals   09/20/20 1457  BP: (!) 142/94  Pulse: 66  Weight: (!) 311 lb (141.1 kg)  Height: '5\' 9"'  (1.753 m)   Body mass index is 45.93 kg/m.   Wt Readings from Last 3 Encounters:  09/20/20 (!) 311 lb (141.1 kg)  06/05/17 282 lb (127.9 kg)  04/23/17 282 lb (127.9 kg)     Ht Readings from Last 3 Encounters:  09/20/20 '5\' 9"'  (1.753 m)  06/05/17 '5\' 9"'  (1.753 m)  04/23/17 '5\' 9"'  (1.753 m)      General: The patient is awake, alert and appears not in acute distress.  The patient is well groomed. Head: Normocephalic, atraumatic. Neck is supple. Mallampati 3 plus ,  neck circumference:21 inches . Nasal airflow patent.   Retrognathia is not seen, but crowded dentition, wore night guard for bruxism. .  Dental status:  Cardiovascular:  Regular rate and cardiac rhythm by pulse,  without distended neck veins. Respiratory: Lungs are clear to auscultation.  Skin:  Without evidence of ankle edema, or rash. Trunk: The patient's posture is erect.   Neurologic exam : The patient is awake and alert, oriented to  place and time.   Memory subjective described as intact.  Attention span & concentration ability appears normal.  Speech is fluent,  without  dysarthria, dysphonia or aphasia.  Mood and affect are appropriate.   Cranial nerves: no loss of smell or taste reported  Pupils are equal and briskly reactive to light. Funduscopic exam deferred.  Extraocular movements in vertical and horizontal planes were intact and without nystagmus. No Diplopia. Visual fields by finger perimetry are intact. Hearing was intact to soft voice and finger rubbing.    Facial sensation intact to fine touch.  Facial motor strength is symmetric and tongue and uvula move midline.  Neck ROM : rotation, tilt and flexion extension were normal for age and shoulder shrug was symmetrical.    Motor exam:  Symmetric bulk, tone and ROM.   Normal tone , symmetric grip strength .   Sensory:  Fine touch, pinprick and vibration were normal.  Proprioception tested in the upper extremities was normal.   Coordination: Rapid alternating movements in the fingers/hands were of normal speed.  The Finger-to-nose maneuver was intact without evidence of ataxia, dysmetria or tremor.   Gait and station: Patient could rise unassisted from a seated position, walked without assistive device.  Stance is of normal width/ base and the patient turned with steps.  Toe and heel walk were deferred.  Deep tendon reflexes: in the  upper and lower extremities are symmetric and intact.  Babinski response was deferred.       After spending a total time of  35  minutes face to face and additional time for physical and neurologic examination, review of laboratory studies,  personal review of imaging studies, reports and results of other testing and review of referral information / records as far as provided in visit, I have established the following assessments:  1) Mr. Michael Liu presents with a list of diagnoses that are related to metabolic syndrome such  as hypertension, hyperlipidemia hypertriglyceridemia, impaired fasting glucose, hypothyroidism but also continues weight gain over several years which she is now in the process of reversing.  Recent weight gain started snoring he has a longstanding problem of insomnia and very fragmented sleep and has continuously been taking Ambien.  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  His obesity, narrow upper airway structure and neck circumference all make him very likely to have obstructive sleep apnea and he is also treated with some medications that would in addition aggravate this condition.  Such as cyclobenzaprine as a muscle relaxant, and zolpidem itself works on the GABAergic effect.  He has improved his lifestyle and has implemented better sleep hygiene by restricting his intake of food after 7 PM, he has eliminated caffeine and drinks much more water now.  All this has helped him already to get better more restful sleep he seems to be surprised that it has worked so well.  I reviewed his recent labs from 24 June 2020 and his fasting glucose 123 alk phos was elevated potassium was very low TSH PSA and free T4 were normal so my goal is to evaluate the patient by home sleep test or attended sleep study dependent on his insurance preference to screen for obstructive sleep apnea.   His insurance coverage is through [facility] which will actually determine if a home sleep test or PSG to be the best way.    My Plan is to proceed with: PSG and HST order.    I would like to thank Marton Redwood, MD , [address] [place],  [place] [address] for allowing me to meet with and to take care of this pleasant patient.  Rv in 2-3 moth if positive result.   Electronically signed by: Larey Seat, MD 09/20/2020 3:21 PM  [facility] Board certified by The [employer] of Sleep Medicine and Diplomate of the [employer] of Sleep Medicine. Board certified  In Neurology through the [place], Fellow of the [employer] of Neurology. Medical Director of [facility].

## 2020-09-20 NOTE — Patient Instructions (Signed)

## 2020-09-21 ENCOUNTER — Telehealth: Payer: Self-pay

## 2020-09-21 NOTE — Telephone Encounter (Signed)
LVM for pt to call me back to schedule sleep study  

## 2020-09-29 ENCOUNTER — Telehealth: Payer: Self-pay

## 2020-09-29 NOTE — Telephone Encounter (Signed)
LVM to schedule for in lab overnight study

## 2020-10-18 ENCOUNTER — Ambulatory Visit (INDEPENDENT_AMBULATORY_CARE_PROVIDER_SITE_OTHER): Payer: BC Managed Care – PPO | Admitting: Neurology

## 2020-10-18 DIAGNOSIS — R0683 Snoring: Secondary | ICD-10-CM

## 2020-10-18 DIAGNOSIS — R0681 Apnea, not elsewhere classified: Secondary | ICD-10-CM

## 2020-10-18 DIAGNOSIS — G4734 Idiopathic sleep related nonobstructive alveolar hypoventilation: Secondary | ICD-10-CM

## 2020-10-18 DIAGNOSIS — G4733 Obstructive sleep apnea (adult) (pediatric): Secondary | ICD-10-CM | POA: Diagnosis not present

## 2020-10-18 DIAGNOSIS — G478 Other sleep disorders: Secondary | ICD-10-CM

## 2020-11-01 DIAGNOSIS — G4734 Idiopathic sleep related nonobstructive alveolar hypoventilation: Secondary | ICD-10-CM | POA: Insufficient documentation

## 2020-11-01 DIAGNOSIS — G4733 Obstructive sleep apnea (adult) (pediatric): Secondary | ICD-10-CM | POA: Insufficient documentation

## 2020-11-01 NOTE — Progress Notes (Signed)
IMPRESSION:   1. Severe Complex, mostly Obstructive Sleep Apnea (OSA) at AHI  63.2/h, and REM AHI of 98.9 /h.  2. Severe sleep hypoxia for a total desaturation time of 191  minutes, nadir SpO2 at 66%/  3. Primary Snoring    RECOMMENDATIONS:  URGENT return for this severe apnea patient-  he would have qualified for emergency SPLIT   1. Advise urgent full-night, attended, PAP titration study to  optimize therapy.  2. Please offer oxygen if AHI reaches 0.0/h and nadir remains  low.

## 2020-11-01 NOTE — Procedures (Signed)
PATIENT'S NAME:  Yuchen, Fedor DOB:      14-Dec-1971      MR#:    [id number]     DATE OF RECORDING: 10/18/2020 [facility] REFERRING M.D.:  Marton Redwood, MD Study Performed:   Baseline Polysomnogram HISTORY:  Berlie Hatchel is a 49 - year- old Caucasian male patient and seen here upon referral for consultation on 09/20/2020. Chief concern: " I am more tired, I use Ambien to sleep, and I gained weight".    I have the pleasure of seeing Eliot Ford today, a right -handed male patient with a possible sleep disorder who has a medical history of Diverticulitis, High cholesterol, Hypertension, Hypertriglyceridemia, Hypothyroidism, Insomnia, Metabolic syndrome, Nephrolithiasis, and Morbid Obesity (BMI 45).  A previous sleep study in 2005(?) yielded no results.   The patient endorsed the Epworth Sleepiness Scale at 9/24 points.   The patient's weight 311 pounds with a height of 69 (inches), resulting in a BMI of 46. kg/m2. The patient's neck circumference measured 21 inches.  CURRENT MEDICATIONS: Norvasc, Lipitor, Hygroton, Synthroid, Toprol XL, K-Dur, Probiotics, Diovan, Ambien.   PROCEDURE:  This is a multichannel digital polysomnogram utilizing the Somnostar 11.2 system.  Electrodes and sensors were applied and monitored per AASM Specifications.   EEG, EOG, Chin and Limb EMG, were sampled at 200 Hz.  ECG, Snore and Nasal Pressure, Thermal Airflow, Respiratory Effort, CPAP Flow and Pressure, Oximetry was sampled at 50 Hz. Digital video and audio were recorded.      BASELINE STUDY: Lights Out was at 22:09 and Lights On at 05:00.  Total recording time (TRT) was 411 minutes, with a total sleep time (TST) of 380.5 minutes.   The patient's sleep latency was 10.5 minutes.  REM latency was 200 minutes.  The sleep efficiency was 92.6 %.     SLEEP ARCHITECTURE: WASO (Wake after sleep onset) was 27 minutes.  There were 11.5 minutes in Stage N1, 340.5 minutes Stage N2, 0 minutes Stage N3 and 28.5 minutes in Stage REM.   The percentage of Stage N1 was 3.%, Stage N2 was 89.5%, Stage N3 was 0% and Stage R (REM sleep) was 7.5%.   RESPIRATORY ANALYSIS:  There were a total of 401 respiratory events:  228 obstructive apneas, 90 central apneas and 0 mixed apneas with 83 hypopneas.     The total APNEA/HYPOPNEA INDEX (AHI) was 63.2/hour.  47 events occurred in REM sleep and 228 events in NREM. The REM AHI was  98.9 /hour, versus a non-REM AHI of 60.3/h. The patient spent 124.5 minutes of total sleep time in the supine position and 256 minutes in non-supine. The supine AHI was 84.4/h versus a non-supine AHI of 53.0/h.  OXYGEN SATURATION & C02:  The Wake baseline 02 saturation was 90%, with the lowest being 66%. Time spent below 89% saturation equaled 191 minutes.  The arousals were noted as: 72 were spontaneous, 3 were associated with PLMs, 233 were associated with respiratory events. The patient had a total of 40 Periodic Limb Movements.  The Periodic Limb Movement (PLM) index Arousal index was 0.5/hour. Audio and video analysis did not show any abnormal or unusual movements, behaviors, phonations or vocalizations.   Snoring was noted. EKG was in keeping with normal sinus rhythm (NSR).   IMPRESSION:  1. Severe Complex, mostly Obstructive Sleep Apnea (OSA) at AHI 63.2/h, and REM AHI of 98.9 /h.  2. Severe sleep hypoxia for a total desaturation time of 191 minutes, nadir SpO2 at 66%/ 3. Primary Snoring  RECOMMENDATIONS:  1. Advise urgent full-night, attended, PAP titration study to optimize therapy. 2. Please offer oxygen if AHI reaches 0.0/h and nadir remains low.       I certify that I have reviewed the entire raw data recording prior to the issuance of this report in accordance with the Standards of Accreditation of the American Academy of Sleep Medicine (AASM)   Larey Seat, MD Diplomat, American Board of Psychiatry and Neurology  Diplomat, American Board of Sleep Medicine Market researcher, [place]  Sleep at [employer]

## 2020-11-01 NOTE — Addendum Note (Signed)
Addended by: Larey Seat on: 11/01/2020 02:44 PM   Modules accepted: Orders

## 2020-11-02 ENCOUNTER — Encounter: Payer: Self-pay | Admitting: Neurology

## 2020-11-02 ENCOUNTER — Telehealth: Payer: Self-pay | Admitting: Neurology

## 2020-11-02 NOTE — Telephone Encounter (Signed)
Called patient to discuss sleep study results. No answer at this time. LVM for the patient to call back.  Will send a mychart message as well. 

## 2020-11-02 NOTE — Telephone Encounter (Signed)
-----   Message from Larey Seat, MD sent at 11/01/2020  2:44 PM EST ----- IMPRESSION:   1. Severe Complex, mostly Obstructive Sleep Apnea (OSA) at AHI  63.2/h, and REM AHI of 98.9 /h.  2. Severe sleep hypoxia for a total desaturation time of 191  minutes, nadir SpO2 at 66%/  3. Primary Snoring    RECOMMENDATIONS:  URGENT return for this severe apnea patient-  he would have qualified for emergency SPLIT   1. Advise urgent full-night, attended, PAP titration study to  optimize therapy.  2. Please offer oxygen if AHI reaches 0.0/h and nadir remains  low.

## 2020-11-03 ENCOUNTER — Telehealth: Payer: Self-pay

## 2020-11-06 ENCOUNTER — Ambulatory Visit (INDEPENDENT_AMBULATORY_CARE_PROVIDER_SITE_OTHER): Payer: BC Managed Care – PPO | Admitting: Neurology

## 2020-11-06 DIAGNOSIS — G4733 Obstructive sleep apnea (adult) (pediatric): Secondary | ICD-10-CM | POA: Diagnosis not present

## 2020-11-06 DIAGNOSIS — G4734 Idiopathic sleep related nonobstructive alveolar hypoventilation: Secondary | ICD-10-CM

## 2020-11-10 NOTE — Telephone Encounter (Signed)
error 

## 2020-11-12 NOTE — Procedures (Signed)
PATIENT'S NAME:  Michael Liu, Michael Liu DOB:      [birth year]      MR#:    [id number]     DATE OF RECORDING: 11/06/2020 [facility] REFERRING M.D.:  Carmie Kanner, MD Study Performed:  Titration to positive airway pressure HISTORY:  Michael Liu returns for a PAP Titration sleep study following the results of his Baseline PSG from 10/18/20. Patient had an AHI of 63.2/h, REM AHI of 98.9/h, supine AHI of 84.4/h and an oxygen nadir SpO2 of 65% with a critically low oxygen saturation over 191 minutes (!).  Dx: Severe Complex apnea, mostly Obstructive Sleep Apnea (OSA) at AHI 63.2/h, and REM AHI of 98.9 /h. Severe sleep hypoxia for a total desaturation time of 191 minutes, nadir SpO2 at 65% and Primary Snoring   The patient endorsed the Epworth Sleepiness Scale at 9/24 points.   The patient's weight 311 pounds with a height of 69 (inches), resulting in a BMI of 46. kg/m2. The patient's neck circumference measured 21 inches.  CURRENT MEDICATIONS: Norvasc, Lipitor, Hygroton, Synthroid, Toprol XL, K-Dur, Probiotics, Diovan, Ambien.  PROCEDURE:  This is a multichannel digital polysomnogram utilizing the SomnoStar 11.2 system.  Electrodes and sensors were applied and monitored per AASM Specifications.   EEG, EOG, Chin and Limb EMG, were sampled at 200 Hz.  ECG, Snore and Nasal Pressure, Thermal Airflow, Respiratory Effort, CPAP Flow and Pressure, Oximetry was sampled at 50 Hz. Digital video and audio were recorded.      The patient was fitted for a VITERA FFM in large size.  CPAP was initiated at 5 cmH20 with heated humidity per AASM split night standards and pressure was advanced to 15 cmH20 because of hypopneas, apneas and desaturations.  At a PAP pressure of 15 cmH20, there were 29 minutes of sleep observed-including REM sleep-and a reduction of the AHI to 0.0/h with improvement of desaturation was noted, at now 92%.  Lights Out was at 20:56 and Lights On at 05:00. Total recording time (TRT) was 484.5 minutes, with a  total sleep time (TST) of 396 minutes. The patient's sleep latency was prolonged at 50 minutes. REM latency was 84.5 minutes.  The sleep efficiency was 81.7 %.    SLEEP ARCHITECTURE: WASO (Wake after sleep onset) was 41.5 minutes.  There were 10 minutes in Stage N1, 287 minutes Stage N2, 0 minutes Stage N3 and 99 minutes in Stage REM.  The percentage of Stage N1 was 2.5%, Stage N2 was 72.5%, Stage N3 was 0% and Stage R (REM sleep) was 25%.    RESPIRATORY ANALYSIS:  There was a total of 58 respiratory events: 21 obstructive apneas, 3 central apneas and 0 mixed apneas with a total of 24 apneas and an apnea index (AI) of 3.6 /hour. There were 34 hypopneas with a hypopnea index of 5.2/hour. The total APNEA/HYPOPNEA INDEX (AHI) was 8.8 /hour.   28 events occurred in REM sleep and 30 events in NREM. The REM AHI was 17. /hour versus a non-REM AHI of 6.1 /hour.  The patient spent 236 minutes of total sleep time in the supine position and 160 minutes in non-supine. The supine AHI was 8.2/h, versus a non-supine AHI of 9.8/h.  OXYGEN SATURATION & C02:  The baseline 02 saturation was 94%, with the lowest being 78%. Time spent below 89% saturation equaled 19 minutes.  The arousals were noted as: 24 were spontaneous, 3 were associated with PLMs, 20 were associated with respiratory events.  The patient had a total of 41  Periodic Limb Movements, mainly all in one cluster of 30 minutes- none in REM sleep, and not at the final pressures. The Periodic Limb Movement (PLM) index Arousal index was 0.5 /hour. Audio and video analysis did not show any abnormal or unusual movements, behaviors, phonations or vocalizations.   EKG was in keeping with normal sinus rhythm (NSR).  DIAGNOSIS 1. Severe Sleep Apnea with oxygen desaturation and snoring responded favorable to CPAP therapy, best result was at 15 cm water pressure.     PLANS/RECOMMENDATIONS: 1. The patient was fitted with a F&P Vitera Large full- face mask. He will  require an auto CPAP device with settings from 7 through 18 cm water, 2 cm EPR and heated humidification.  2. Patient to pursue reduction in weight, BMI. 3. Any apnea patient should avoid sedatives, hypnotics, and alcohol consumption at bedtime as these substances worsen apnea.  4. CPAP therapy compliance is defined as 4 hours or more of nightly use.   DISCUSSION: A follow up appointment will be scheduled in the Sleep Clinic at [facility].   Please call [phone] with any questions.      I certify that I have reviewed the entire raw data recording prior to the issuance of this report in accordance with the Standards of Accreditation of the American Academy of Sleep Medicine (AASM)   Larey Seat, M.D. Diplomat, Tax adviser of Psychiatry and Neurology  Diplomat, Tax adviser of Sleep Medicine Market researcher, [employer] [facility] at [employer]

## 2020-11-12 NOTE — Progress Notes (Signed)
DIAGNOSIS  1. Severe Sleep Apnea with oxygen desaturation and snoring  responded favorable to CPAP therapy, best result was at 15 cm  water pressure.     PLANS/RECOMMENDATIONS:  1. The patient was fitted with a F&P Vitera Large full- face  mask. He will require an auto CPAP device with settings from 7  through 18 cm water, 2 cm EPR and heated humidification.  2. Patient to pursue reduction in weight, BMI.  3. Any apnea patient should avoid sedatives, hypnotics, and  alcohol consumption at bedtime as these substances worsen apnea.  4. CPAP therapy compliance is defined as 4 hours or more of  nightly use.

## 2020-11-12 NOTE — Addendum Note (Signed)
Addended by: Larey Seat on: 11/12/2020 07:00 PM   Modules accepted: Orders

## 2020-11-30 DIAGNOSIS — G4733 Obstructive sleep apnea (adult) (pediatric): Secondary | ICD-10-CM | POA: Diagnosis not present

## 2020-12-23 DIAGNOSIS — G4733 Obstructive sleep apnea (adult) (pediatric): Secondary | ICD-10-CM | POA: Diagnosis not present

## 2020-12-31 DIAGNOSIS — G4733 Obstructive sleep apnea (adult) (pediatric): Secondary | ICD-10-CM | POA: Diagnosis not present

## 2021-01-31 DIAGNOSIS — G4733 Obstructive sleep apnea (adult) (pediatric): Secondary | ICD-10-CM | POA: Diagnosis not present

## 2021-02-28 ENCOUNTER — Ambulatory Visit: Payer: BC Managed Care – PPO | Admitting: Family Medicine

## 2021-02-28 ENCOUNTER — Encounter: Payer: Self-pay | Admitting: Family Medicine

## 2021-02-28 VITALS — BP 124/74 | HR 65 | Ht 69.0 in | Wt 326.0 lb

## 2021-02-28 DIAGNOSIS — G4733 Obstructive sleep apnea (adult) (pediatric): Secondary | ICD-10-CM

## 2021-02-28 DIAGNOSIS — Z9989 Dependence on other enabling machines and devices: Secondary | ICD-10-CM

## 2021-02-28 NOTE — Progress Notes (Signed)
PATIENT: Michael Liu DOB: 1971-06-18  REASON FOR VISIT: follow up HISTORY FROM: patient  Chief Complaint  Patient presents with  . Follow-up    RM 1 alone Pt is well, sleeps good. No complaints      HISTORY OF PRESENT ILLNESS: 02/28/21 ALL:  Michael Liu is a 50 y.o. male here today for follow up for OSA on CPAP. Sleep study showed "Severe Complex, mostly Obstructive Sleep Apnea (OSA) at AHI 63.2/h, and REM AHI of 98.9 /h. 2. Severe sleep hypoxia for a total desaturation time of 191 minutes, nadir SpO2 at 66%". Auto PAP was ordered with pressures of 7-18cmH20. He is doing well on CPAP therapy. He does feel more energized and better rested. He denies concerns with machine but has had a leak coming from his mask. He is using a small FFM. He would like to see if another mask would be a better fit for him.   Compliance report dated 01/28/2021 through 02/26/2021 reveals that he used CPAP 29 of the past 30 days for compliance of 97%.  He is CPAP greater than 4 hours 26 of the past 30 days for compliance of 87%.  Average usage on days used was 6 hours and 41 minutes.  Residual AHI was 3.4 on 7 to 18 cm of water and an EPR of 2.  There was a leak noted in the 95th percentile of 60.9 L/min.   HISTORY: (copied from Michael Liu's previous note)  Michael Liu a 58 - year- old Caucasian male patientand seen here upon a  referral for consultation on 09/20/2020. Referred b Michael Liu.  Chiefconcernaccording to patient : "I am more tired, I use Ambien to sleep, and I gained weight".   I have the pleasure of seeing Michael Liu today,a right -handed  male patient with a possible sleep disorder and he  has a past medical history of Diverticulitis, High cholesterol, Hypertension, Hypertriglyceridemia, Hypothyroidism, Insomnia, Metabolic syndrome, Nephrolithiasis, and Morbid Obesity (BMI 45). A previous sleep study in 2005(?) yielded no results.   Sleeprelevant medical history: Nocturia once-  some snoring.  Familymedical /sleep history:No other family member with OSA, insomnia, sleep walkers.  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  He lives in a household with spouse and children. The patient currently works in daytime, 7-5 pm. No pets are  present. Tobacco use, never . ETOH use; rare,  Caffeine intake in form of Coffee( 2 a day - but stopped 14 days ago. Stopped eating after 7 Pm.  Regular exercise - no yet.   Hobbies : football , Development worker, international aid.     Sleep habits are as follows:The patient's dinner time is between 5-6  PM. The patient goes to bed at 10 PM and continues to sleep for 3-4 hours, wakes for  bathroom breaks, the first time at 2 AM.  Bedroom is cool, dark and quiet- The preferred sleep position is on his right side, with the support of 1 pillow.  Dreams are reportedly infrequent.  7 AM is the usual rise time. The patient wakes up spontaneously/b 5.30 and stays in bed.   He reports not feeling refreshed or restored in AM, but this improved over the last 14 days.  He often had woken with symptoms such as residual fatigue.  Naps are taken infrequently,'never been a napper" .  Review of Systems: Out of a complete 14 system review, the patient complains of only the following symptoms, and all other  reviewed systems are negative.:  Fatigue, sleepiness , wife reports apnea and snoring, fragmented sleep, Insomnia - improving. But still on ambien.  How likely are you to doze in the following situations: 0 = not likely, 1 = slight chance, 2 = moderate chance, 3 = high chance  Sitting and Reading? Watching Television? Sitting inactive in a public place (theater or meeting)? As a passenger in a car for an hour without a break? Lying down in the afternoon when circumstances permit? Sitting and talking to someone? Sitting quietly after lunch without alcohol? In a car, while stopped for a few minutes in traffic?  Total  =9/ 24 points  FSS endorsed at 20/ 63 points.     REVIEW OF SYSTEMS: Out of a complete 14 system review of symptoms, the patient complains only of the following symptoms, none and all other reviewed systems are negative.  ESS:3 FSS: 17   ALLERGIES: Allergies  Allergen Reactions  . Codeine Other (See Comments)  . Percocet [Oxycodone-Acetaminophen] Other (See Comments)    Made him feel funny    HOME MEDICATIONS: Outpatient Medications Prior to Visit  Medication Sig Dispense Refill  . amLODipine (NORVASC) 5 MG tablet Take 5 mg by mouth daily.    Marland Kitchen atorvastatin (LIPITOR) 20 MG tablet Take 20 mg by mouth every morning.    . chlorthalidone (HYGROTON) 50 MG tablet Take 100 mg by mouth daily.    Marland Kitchen levothyroxine (SYNTHROID, LEVOTHROID) 75 MCG tablet Take 75 mcg by mouth daily before breakfast.    . metoprolol succinate (TOPROL-XL) 100 MG 24 hr tablet Take 100 mg by mouth daily.    . potassium chloride SA (K-DUR,KLOR-CON) 20 MEQ tablet Take 20 mEq by mouth every morning.     . Probiotic Product (PROBIOTIC DAILY PO) Take 1 capsule by mouth daily.    . valsartan (DIOVAN) 320 MG tablet Take 320 mg by mouth daily.    Marland Kitchen zolpidem (AMBIEN CR) 12.5 MG CR tablet Take 12.5 mg by mouth at bedtime.     No facility-administered medications prior to visit.    PAST MEDICAL HISTORY: Past Medical History:  Diagnosis Date  . Diverticulitis   . High cholesterol   . Hypertension   . Hypertriglyceridemia   . Hypothyroidism   . Insomnia   . Metabolic syndrome   . Nephrolithiasis   . Obesity (BMI 30-39.9)     PAST SURGICAL HISTORY: Past Surgical History:  Procedure Laterality Date  . COLONOSCOPY    . TONSILLECTOMY    . WISDOM TOOTH EXTRACTION      FAMILY HISTORY: Family History  Problem Relation Age of Onset  . Basal cell carcinoma Mother   . Hypertension Father   . Hypertension Sister   . Diabetes Sister   . Hypertension Brother   . Cancer Neg Hx   . Colon cancer Neg Hx   .  Esophageal cancer Neg Hx   . Pancreatic cancer Neg Hx   . Prostate cancer Neg Hx   . Rectal cancer Neg Hx   . Stomach cancer Neg Hx     SOCIAL HISTORY: Social History   Socioeconomic History  . Marital status: Married    Spouse name: Not on file  . Number of children: Not on file  . Years of education: Not on file  . Highest education level: Not on file  Occupational History  . Occupation: fundraiser for schools  Tobacco Use  . Smoking status: Never Smoker  . Smokeless tobacco: Never Used  Vaping Use  .  Vaping Use: Never used  Substance and Sexual Activity  . Alcohol use: No  . Drug use: No  . Sexual activity: Not on file  Other Topics Concern  . Not on file  Social History Narrative  . Not on file   Social Determinants of Health   Financial Resource Strain: Not on file  Food Insecurity: Not on file  Transportation Needs: Not on file  Physical Activity: Not on file  Stress: Not on file  Social Connections: Not on file  Intimate Partner Violence: Not on file     PHYSICAL EXAM  Vitals:   02/28/21 1551  BP: 124/74  Liu: 64  Weight: (!) 326 lb (147.9 kg)  Height: 5\' 9"  (1.753 m)   Body mass index is 48.14 kg/m.  Generalized: Well developed, in no acute distress  Cardiology: normal rate and rhythm, no murmur noted Respiratory: clear to auscultation bilaterally  Neurological examination  Mentation: Alert oriented to time, place, history taking. Follows all commands speech and language fluent Cranial nerve II-XII: Pupils were equal round reactive to light. Extraocular movements were full, visual field were full  Motor: The motor testing reveals 5 over 5 strength of all 4 extremities. Good symmetric motor tone is noted throughout.  Gait and station: Gait is normal.    DIAGNOSTIC DATA (LABS, IMAGING, TESTING) - I reviewed patient records, labs, notes, testing and imaging myself where available.  No flowsheet data found.   Lab Results  Component Value  Date   WBC 10.9 (H) 03/05/2017   HGB 12.7 (L) 03/05/2017   HCT 37.1 (L) 03/05/2017   MCV 83.4 03/05/2017   PLT 160 03/05/2017      Component Value Date/Time   NA 139 03/05/2017 0528   K 3.0 (L) 03/05/2017 0528   CL 106 03/05/2017 0528   CO2 29 03/05/2017 0528   GLUCOSE 117 (H) 03/05/2017 0528   BUN 6 03/05/2017 0528   CREATININE 0.76 03/05/2017 0528   CALCIUM 8.2 (L) 03/05/2017 0528   PROT 7.6 03/02/2017 1138   ALBUMIN 4.1 03/02/2017 1138   AST 22 03/02/2017 1138   ALT 25 03/02/2017 1138   ALKPHOS 110 03/02/2017 1138   BILITOT 1.3 (H) 03/02/2017 1138   GFRNONAA >60 03/05/2017 0528   GFRAA >60 03/05/2017 0528   No results found for: CHOL, HDL, LDLCALC, LDLDIRECT, TRIG, CHOLHDL No results found for: HGBA1C No results found for: VITAMINB12 No results found for: TSH   ASSESSMENT AND PLAN 50 y.o. year old male  has a past medical history of Diverticulitis, High cholesterol, Hypertension, Hypertriglyceridemia, Hypothyroidism, Insomnia, Metabolic syndrome, Nephrolithiasis, and Obesity (BMI 30-39.9). here with     ICD-10-CM   1. OSA on CPAP  G47.33 For home use only DME continuous positive airway pressure (CPAP)   Z99.89      Michael Liu is doing well on CPAP therapy. Compliance report reveals excellent compliance. He was encouraged to continue using CPAP nightly and for greater than 4 hours each night. I will send mask refit order to DME. He was educated on how to monitor for leak at home. I will reprint download in 45-60 days to assess. Risks of untreated sleep apnea review and education materials provided. Healthy lifestyle habits encouraged. He will follow up in 1 year, sooner if needed. He verbalizes understanding and agreement with this plan.    Orders Placed This Encounter  Procedures  . For home use only DME continuous positive airway pressure (CPAP)    Mask refit and education,  has large air leak    Order Specific Question:   Length of Need    Answer:   Lifetime     Order Specific Question:   Patient has OSA or probable OSA    Answer:   Yes    Order Specific Question:   Is the patient currently using CPAP in the home    Answer:   Yes    Order Specific Question:   Settings    Answer:   Other see comments    Order Specific Question:   CPAP supplies needed    Answer:   Mask, headgear, cushions, filters, heated tubing and water chamber     No orders of the defined types were placed in this encounter.     I spent 15 minutes with the patient. 50% of this time was spent counseling and educating patient on plan of care and medications.    Debbora Presto, FNP-C 02/28/2021, 4:23 PM [facility] [address] [place], [place] [address] [phone]

## 2021-02-28 NOTE — Patient Instructions (Signed)
Please continue using your CPAP regularly. While your insurance requires that you use CPAP at least 4 hours each night on 70% of the nights, I recommend, that you not skip any nights and use it throughout the night if you can. Getting used to CPAP and staying with the treatment long term does take time and patience and discipline. Untreated obstructive sleep apnea when it is moderate to severe can have an adverse impact on cardiovascular health and raise her risk for heart disease, arrhythmias, hypertension, congestive heart failure, stroke and diabetes. Untreated obstructive sleep apnea causes sleep disruption, nonrestorative sleep, and sleep deprivation. This can have an impact on your day to day functioning and cause daytime sleepiness and impairment of cognitive function, memory loss, mood disturbance, and problems focussing. Using CPAP regularly can improve these symptoms.  We will send a mask refitting order to [facility] to assist with finding the air leak. Continue to monitor at home. I will reprint download to assess leak in 45-60 days. I will call with any concerns.   Follow up in 1 year   Sleep Apnea Sleep apnea affects breathing during sleep. It causes breathing to stop for a short time or to become shallow. It can also increase the risk of:  Heart attack.  Stroke.  Being very overweight (obese).  Diabetes.  Heart failure.  Irregular heartbeat. The goal of treatment is to help you breathe normally again. What are the causes? There are three kinds of sleep apnea:  Obstructive sleep apnea. This is caused by a blocked or collapsed airway.  Central sleep apnea. This happens when the brain does not send the right signals to the muscles that control breathing.  Mixed sleep apnea. This is a combination of obstructive and central sleep apnea. The most common cause of this condition is a collapsed or blocked airway. This can happen if:  Your throat muscles are too relaxed.  Your  tongue and tonsils are too large.  You are overweight.  Your airway is too small.   What increases the risk?  Being overweight.  Smoking.  Having a small airway.  Being older.  Being male.  Drinking alcohol.  Taking medicines to calm yourself (sedatives or tranquilizers).  Having family members with the condition. What are the signs or symptoms?  Trouble staying asleep.  Being sleepy or tired during the day.  Getting angry a lot.  Loud snoring.  Headaches in the morning.  Not being able to focus your mind (concentrate).  Forgetting things.  Less interest in sex.  Mood swings.  Personality changes.  Feelings of sadness (depression).  Waking up a lot during the night to pee (urinate).  Dry mouth.  Sore throat. How is this diagnosed?  Your medical history.  A physical exam.  A test that is done when you are sleeping (sleep study). The test is most often done in a sleep lab but may also be done at home. How is this treated?  Sleeping on your side.  Using a medicine to get rid of mucus in your nose (decongestant).  Avoiding the use of alcohol, medicines to help you relax, or certain pain medicines (narcotics).  Losing weight, if needed.  Changing your diet.  Not smoking.  Using a machine to open your airway while you sleep, such as: ? An oral appliance. This is a mouthpiece that shifts your lower jaw forward. ? A CPAP device. This device blows air through a mask when you breathe out (exhale). ? An EPAP device.  This has valves that you put in each nostril. ? A BPAP device. This device blows air through a mask when you breathe in (inhale) and breathe out.  Having surgery if other treatments do not work. It is important to get treatment for sleep apnea. Without treatment, it can lead to:  High blood pressure.  Coronary artery disease.  In men, not being able to have an erection (impotence).  Reduced thinking ability.   Follow these  instructions at home: Lifestyle  Make changes that your doctor recommends.  Eat a healthy diet.  Lose weight if needed.  Avoid alcohol, medicines to help you relax, and some pain medicines.  Do not use any products that contain nicotine or tobacco, such as cigarettes, e-cigarettes, and chewing tobacco. If you need help quitting, ask your doctor. General instructions  Take over-the-counter and prescription medicines only as told by your doctor.  If you were given a machine to use while you sleep, use it only as told by your doctor.  If you are having surgery, make sure to tell your doctor you have sleep apnea. You may need to bring your device with you.  Keep all follow-up visits as told by your doctor. This is important. Contact a doctor if:  The machine that you were given to use during sleep bothers you or does not seem to be working.  You do not get better.  You get worse. Get help right away if:  Your chest hurts.  You have trouble breathing in enough air.  You have an uncomfortable feeling in your back, arms, or stomach.  You have trouble talking.  One side of your body feels weak.  A part of your face is hanging down. These symptoms may be an emergency. Do not wait to see if the symptoms will go away. Get medical help right away. Call your local emergency services (911 in the [place] Do not drive yourself to the hospital. Summary  This condition affects breathing during sleep.  The most common cause is a collapsed or blocked airway.  The goal of treatment is to help you breathe normally while you sleep. This information is not intended to replace advice given to you by your health care provider. Make sure you discuss any questions you have with your health care provider. Document Revised: 09/26/2018 Document Reviewed: 08/05/2018 Elsevier Patient Education  [employer].

## 2021-03-06 NOTE — Progress Notes (Signed)
Cm sent to [facility]

## 2021-03-31 DIAGNOSIS — G4733 Obstructive sleep apnea (adult) (pediatric): Secondary | ICD-10-CM | POA: Diagnosis not present

## 2021-04-30 DIAGNOSIS — G4733 Obstructive sleep apnea (adult) (pediatric): Secondary | ICD-10-CM | POA: Diagnosis not present

## 2021-05-31 DIAGNOSIS — G4733 Obstructive sleep apnea (adult) (pediatric): Secondary | ICD-10-CM | POA: Diagnosis not present

## 2021-06-30 DIAGNOSIS — G4733 Obstructive sleep apnea (adult) (pediatric): Secondary | ICD-10-CM | POA: Diagnosis not present

## 2021-07-03 DIAGNOSIS — Z1212 Encounter for screening for malignant neoplasm of rectum: Secondary | ICD-10-CM | POA: Diagnosis not present

## 2021-07-03 DIAGNOSIS — Z1331 Encounter for screening for depression: Secondary | ICD-10-CM | POA: Diagnosis not present

## 2021-07-03 DIAGNOSIS — Z0000 Encounter for general adult medical examination without abnormal findings: Secondary | ICD-10-CM | POA: Diagnosis not present

## 2021-07-03 DIAGNOSIS — I10 Essential (primary) hypertension: Secondary | ICD-10-CM | POA: Diagnosis not present

## 2021-07-03 DIAGNOSIS — Z1339 Encounter for screening examination for other mental health and behavioral disorders: Secondary | ICD-10-CM | POA: Diagnosis not present

## 2021-07-03 DIAGNOSIS — R82998 Other abnormal findings in urine: Secondary | ICD-10-CM | POA: Diagnosis not present

## 2021-07-07 ENCOUNTER — Other Ambulatory Visit: Payer: Self-pay | Admitting: Internal Medicine

## 2021-07-07 DIAGNOSIS — E781 Pure hyperglyceridemia: Secondary | ICD-10-CM

## 2021-07-31 DIAGNOSIS — G4733 Obstructive sleep apnea (adult) (pediatric): Secondary | ICD-10-CM | POA: Diagnosis not present

## 2021-08-08 ENCOUNTER — Other Ambulatory Visit: Payer: Self-pay

## 2021-08-08 ENCOUNTER — Ambulatory Visit
Admission: RE | Admit: 2021-08-08 | Discharge: 2021-08-08 | Disposition: A | Discharge status: Home / Self Care | Payer: No Typology Code available for payment source | Source: Ambulatory Visit | Attending: Internal Medicine | Admitting: Internal Medicine

## 2021-08-08 DIAGNOSIS — E781 Pure hyperglyceridemia: Secondary | ICD-10-CM

## 2022-03-06 ENCOUNTER — Encounter: Payer: Self-pay | Admitting: Family Medicine

## 2022-03-06 ENCOUNTER — Ambulatory Visit: Payer: 59 | Admitting: Family Medicine

## 2022-03-06 VITALS — BP 140/90 | HR 61 | Ht 69.0 in | Wt 320.5 lb

## 2022-03-06 DIAGNOSIS — Z9989 Dependence on other enabling machines and devices: Secondary | ICD-10-CM

## 2022-03-06 DIAGNOSIS — G4733 Obstructive sleep apnea (adult) (pediatric): Secondary | ICD-10-CM | POA: Diagnosis not present

## 2022-03-06 NOTE — Progress Notes (Signed)
? ? ?PATIENT: Michael Liu ?DOB: Mar 24, 1971 ? ?REASON FOR VISIT: follow up ?HISTORY FROM: patient ? ?Chief Complaint  ?Patient presents with  ? Follow-up  ?  RM 2.   ?  ? ?HISTORY OF PRESENT ILLNESS: ? ?03/06/22 ALL:  ?[name] returns for follow up for OSA on CPAP. He continues to do well. He is using CPAP nightly for about 7 hours. He feels much better rested now. He denies concerns with machine or supplies. He reports BP is usually well managed. He is followed regularly by Dr Brigitte Pulse.  ? ? ? ?02/28/2021 ALL:  ?Michael Liu is a 51 y.o. male here today for follow up for OSA on CPAP. Sleep study showed "Severe Complex, mostly Obstructive Sleep Apnea (OSA) at AHI 63.2/h, and REM AHI of 98.9 /h. 2. Severe sleep hypoxia for a total desaturation time of 191 minutes, nadir SpO2 at 66%". Auto PAP was ordered with pressures of 7-18cmH20. He is doing well on CPAP therapy. He does feel more energized and better rested. He denies concerns with machine but has had a leak coming from his mask. He is using a small FFM. He would like to see if another mask would be a better fit for him.  ? ?Compliance report dated 01/28/2021 through 02/26/2021 reveals that he used CPAP 29 of the past 30 days for compliance of 97%.  He is CPAP greater than 4 hours 26 of the past 30 days for compliance of 87%.  Average usage on days used was 6 hours and 41 minutes.  Residual AHI was 3.4 on 7 to 18 cm of water and an EPR of 2.  There was a leak noted in the 95th percentile of 60.9 L/min. ? ? ?HISTORY: (copied from Dr Dohmeier's previous note) ? ?[name] is a 61 - year- old Caucasian male patient and seen here upon a  referral  for consultation on 09/20/2020. Referred b Dr Brigitte Pulse.  Chief concern according to patient : " I am more tired, I use Ambien to sleep, and I gained weight".  ?  ?I have the pleasure of seeing Michael Liu today, a right -handed  male patient with a possible sleep disorder and he  has a past medical history of Diverticulitis, High  cholesterol, Hypertension, Hypertriglyceridemia, Hypothyroidism, Insomnia, Metabolic syndrome, Nephrolithiasis, and Morbid Obesity (BMI 45). A previous sleep study in 2005(?) yielded no results.  ?  ?Sleep relevant medical history: Nocturia once- some snoring.   ? Family medical /sleep history: No other family member with OSA, insomnia, sleep walkers.  ?  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  ?He lives in a household with spouse and children. ?The patient currently works in daytime, 7-5 pm. No pets are  present. ?Tobacco use, never .  ETOH use; rare,  ?Caffeine intake in form of Coffee( 2 a day - but stopped 14 days ago. Stopped eating after 7 Pm.  ?Regular exercise - no yet.   ?Hobbies : football , Development worker, international aid.  ?  ?  ?  ?Sleep habits are as follows: The patient's dinner time is between 5-6  PM. The patient goes to bed at 10 PM and continues to sleep for 3-4 hours, wakes for  bathroom breaks, the first time at 2 AM.  Bedroom is cool, dark and quiet- ?The preferred sleep position is on his right side, with the support of 1 pillow.  ?Dreams are reportedly infrequent.  ?7 AM is the usual  rise time. The patient wakes up spontaneously/b 5.30 and stays in bed.   ?He reports not feeling refreshed or restored in AM, but this improved over the last 14 days.  ?He often had woken with symptoms such as residual fatigue.  ?Naps are taken infrequently,'never been a napper" . ?  ?Review of Systems: ?Out of a complete 14 system review, the patient complains of only the following symptoms, and all other reviewed systems are negative.:  ?Fatigue, sleepiness , wife reports apnea and snoring, fragmented sleep, Insomnia - improving. But still on ambien. ?  ?How likely are you to doze in the following situations: ?0 = not likely, 1 = slight chance, 2 = moderate chance, 3 = high chance ?  ?Sitting and Reading? ?Watching Television? ?Sitting inactive in a public place (theater or meeting)? ?As a  passenger in a car for an hour without a break? ?Lying down in the afternoon when circumstances permit? ?Sitting and talking to someone? ?Sitting quietly after lunch without alcohol? ?In a car, while stopped for a few minutes in traffic? ?  ?Total = 9/ 24 points  ? FSS endorsed at 20/ 63 points.  ? ? ? ?REVIEW OF SYSTEMS: Out of a complete 14 system review of symptoms, the patient complains only of the following symptoms, none and all other reviewed systems are negative. ? ?ESS:3 ? ? ? ?ALLERGIES: ?Allergies  ?Allergen Reactions  ? Codeine Other (See Comments)  ? Percocet [Oxycodone-Acetaminophen] Other (See Comments)  ?  Made him feel funny  ? ? ?HOME MEDICATIONS: ?Outpatient Medications Prior to Visit  ?Medication Sig Dispense Refill  ? amLODipine (NORVASC) 5 MG tablet Take 5 mg by mouth daily.    ? atorvastatin (LIPITOR) 20 MG tablet Take 20 mg by mouth every morning.    ? chlorthalidone (HYGROTON) 50 MG tablet Take 100 mg by mouth daily.    ? levothyroxine (SYNTHROID, LEVOTHROID) 75 MCG tablet Take 75 mcg by mouth daily before breakfast.    ? metoprolol succinate (TOPROL-XL) 100 MG 24 hr tablet Take 100 mg by mouth daily.    ? potassium chloride SA (K-DUR,KLOR-CON) 20 MEQ tablet Take 20 mEq by mouth every morning.     ? Probiotic Product (PROBIOTIC DAILY PO) Take 1 capsule by mouth daily.    ? valsartan (DIOVAN) 320 MG tablet Take 320 mg by mouth daily.    ? zolpidem (AMBIEN CR) 12.5 MG CR tablet Take 12.5 mg by mouth at bedtime.    ? atorvastatin (LIPITOR) 20 MG tablet Take 20 mg by mouth daily.    ? ?No facility-administered medications prior to visit.  ? ? ?PAST MEDICAL HISTORY: ?Past Medical History:  ?Diagnosis Date  ? Diverticulitis   ? High cholesterol   ? Hypertension   ? Hypertriglyceridemia   ? Hypothyroidism   ? Insomnia   ? Metabolic syndrome   ? Nephrolithiasis   ? Obesity (BMI 30-39.9)   ? ? ?PAST SURGICAL HISTORY: ?Past Surgical History:  ?Procedure Laterality Date  ? COLONOSCOPY    ?  TONSILLECTOMY    ? WISDOM TOOTH EXTRACTION    ? ? ?FAMILY HISTORY: ?Family History  ?Problem Relation Age of Onset  ? Basal cell carcinoma Mother   ? Hypertension Father   ? Hypertension Sister   ? Diabetes Sister   ? Hypertension Brother   ? Cancer Neg Hx   ? Colon cancer Neg Hx   ? Esophageal cancer Neg Hx   ? Pancreatic cancer Neg Hx   ? Prostate  cancer Neg Hx   ? Rectal cancer Neg Hx   ? Stomach cancer Neg Hx   ? ? ?SOCIAL HISTORY: ?Social History  ? ?Socioeconomic History  ? Marital status: Married  ?  Spouse name: Not on file  ? Number of children: Not on file  ? Years of education: Not on file  ? Highest education level: Not on file  ?Occupational History  ? Occupation: fundraiser for schools  ?Tobacco Use  ? Smoking status: Never  ? Smokeless tobacco: Never  ?Vaping Use  ? Vaping Use: Never used  ?Substance and Sexual Activity  ? Alcohol use: No  ? Drug use: No  ? Sexual activity: Not on file  ?Other Topics Concern  ? Not on file  ?Social History Narrative  ? Not on file  ? ?Social Determinants of Health  ? ?Financial Resource Strain: Not on file  ?Food Insecurity: Not on file  ?Transportation Needs: Not on file  ?Physical Activity: Not on file  ?Stress: Not on file  ?Social Connections: Not on file  ?Intimate Partner Violence: Not on file  ? ? ? ?PHYSICAL EXAM ? ?Vitals:  ? 03/06/22 1522 03/06/22 1547  ?BP: (!) 166/85 140/90  ?Pulse: 61   ?Weight: (!) 320 lb 8 oz (145.4 kg)   ?Height: '5\' 9"'$  (1.753 m)   ? ? ?Body mass index is 47.33 kg/m?. ? ?Generalized: Well developed, in no acute distress  ?Cardiology: normal rate and rhythm, no murmur noted ?Respiratory: clear to auscultation bilaterally  ?Neurological examination  ?Mentation: Alert oriented to time, place, history taking. Follows all commands speech and language fluent ?Cranial nerve II-XII: Pupils were equal round reactive to light. Extraocular movements were full, visual field were full  ?Motor: The motor testing reveals 5 over 5 strength of all 4  extremities. Good symmetric motor tone is noted throughout.  ?Gait and station: Gait is normal.  ? ? ?DIAGNOSTIC DATA (LABS, IMAGING, TESTING) ?- I reviewed patient records, labs, notes, testing and imaging myself wh

## 2022-03-06 NOTE — Patient Instructions (Signed)

## 2022-03-07 NOTE — Progress Notes (Signed)
CM sent to [facility] for new order ?

## 2022-03-14 IMAGING — CT CT CARDIAC CORONARY ARTERY CALCIUM SCORE
3 series · 14 of 20 positions shown, 16 images · non-contrast
Comparison: None.

CLINICAL DATA: Hyperlipidemia

EXAM:
CT CARDIAC CORONARY ARTERY CALCIUM SCORE
TECHNIQUE: Non-contrast imaging through the heart was performed using
prospective ECG gating. Image post processing was performed on an
independent workstation, allowing for quantitative analysis of the
heart and coronary arteries. Note that this exam targets the heart
and the chest was not imaged in its entirety.

[Series 2: calcium scoring 2.00 qr36 bestdiast 70% hrt calciu · axial · 0.47mm/px · z∈[+1711,+1807]mm · 4 of 80 slices shown]
[im 16/80  vessel]
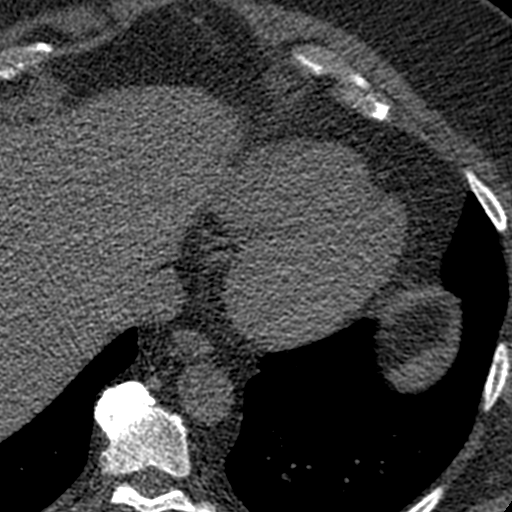
[im 32/80  vessel]
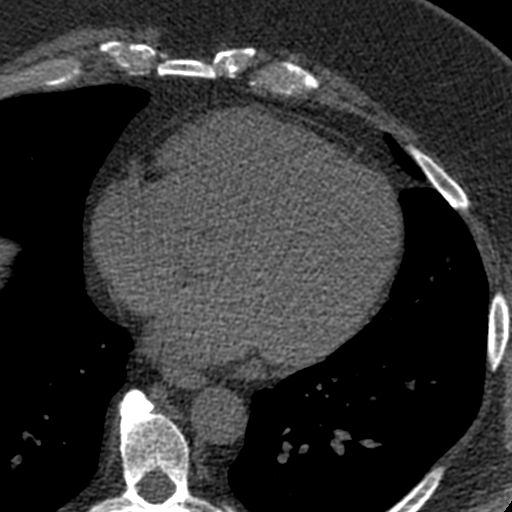
[im 48/80  vessel]
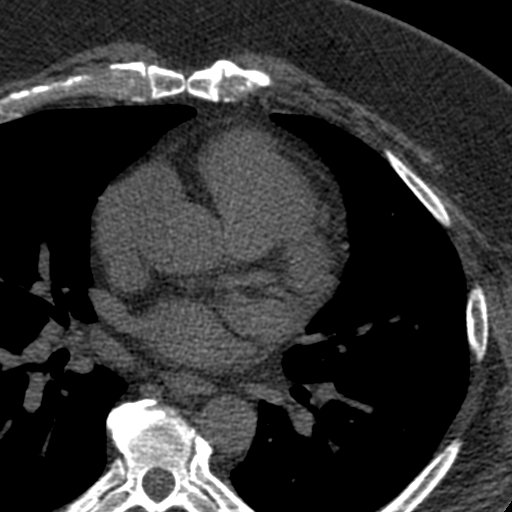
[im 64/80  vessel]
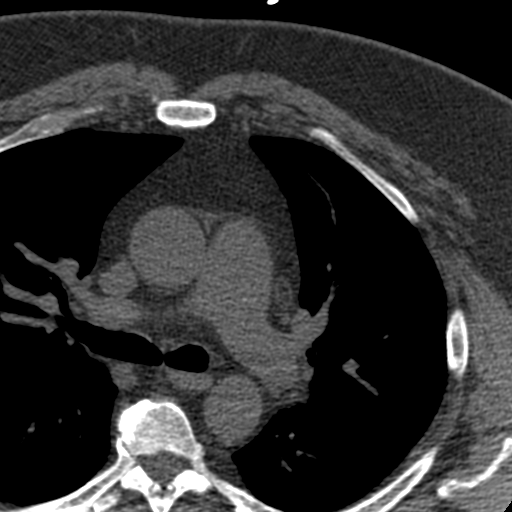

[Series 3: calcium scoring 2.00 br40 bestdiast 70% axial · axial · 0.68mm/px · z∈[+1707,+1811]mm · 5 of 80 slices shown, 7 images]
[im 14/80  vessel]
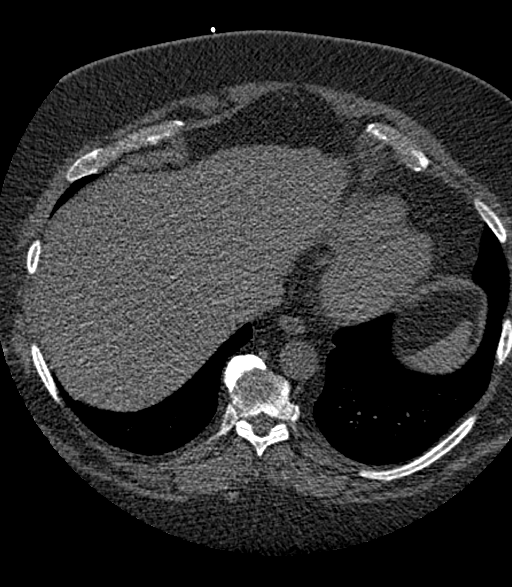
[im 14/80  lung]
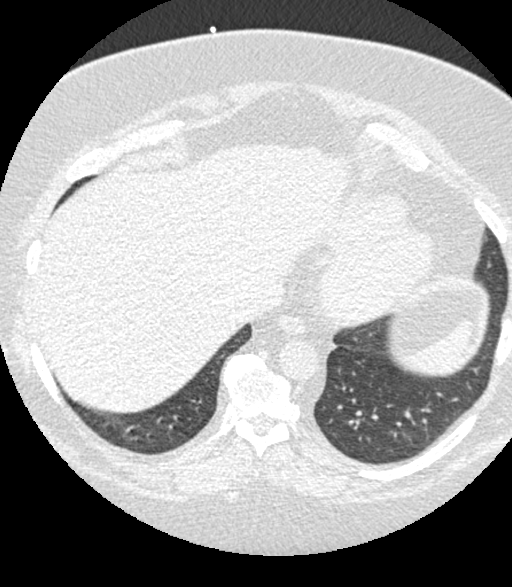
[im 27/80  vessel]
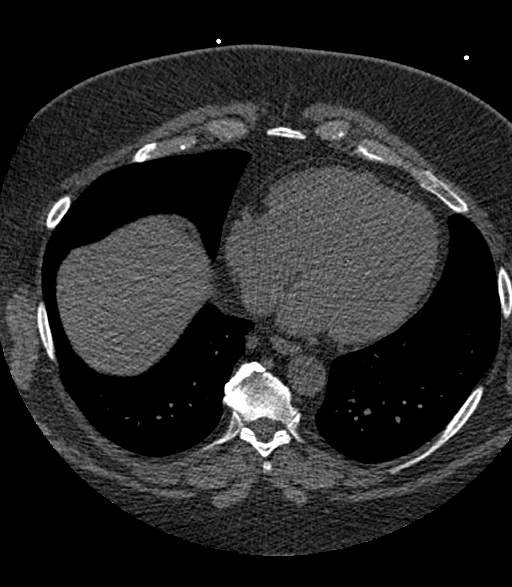
[im 40/80  vessel]
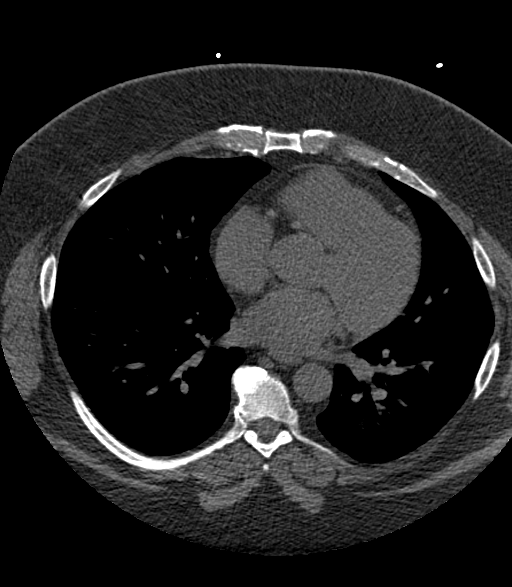
[im 53/80  vessel]
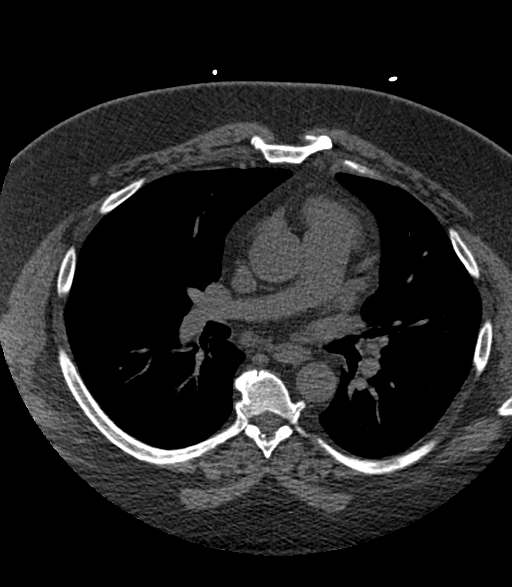
[im 66/80  vessel]
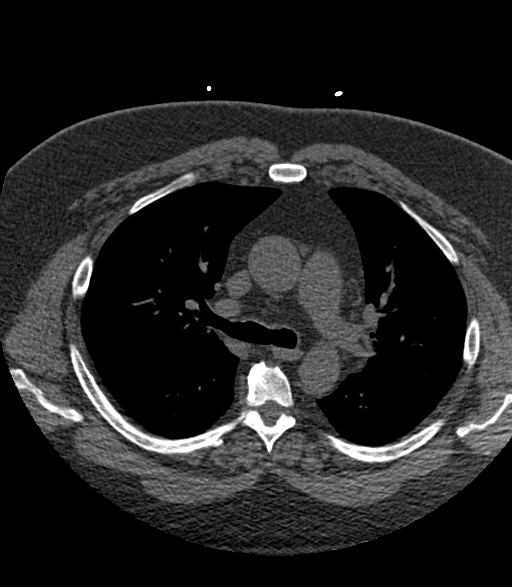
[im 66/80  lung]
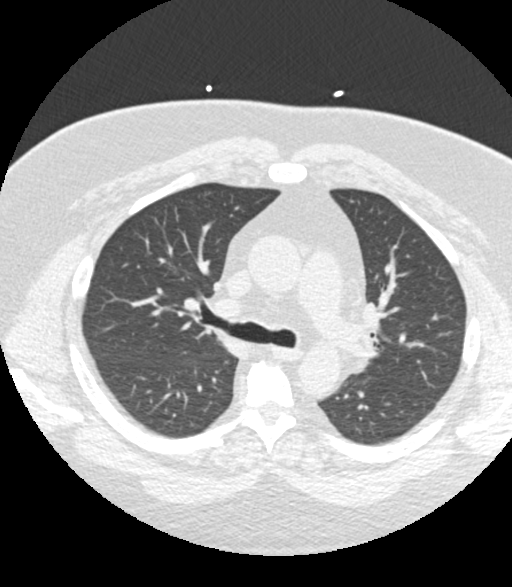

[Series 9: calcium scoring 2.00 br60 bestdiast 70% lungs · axial · 0.70mm/px · z∈[+1707,+1811]mm · 5 of 80 slices shown]
[im 14/80  vessel]
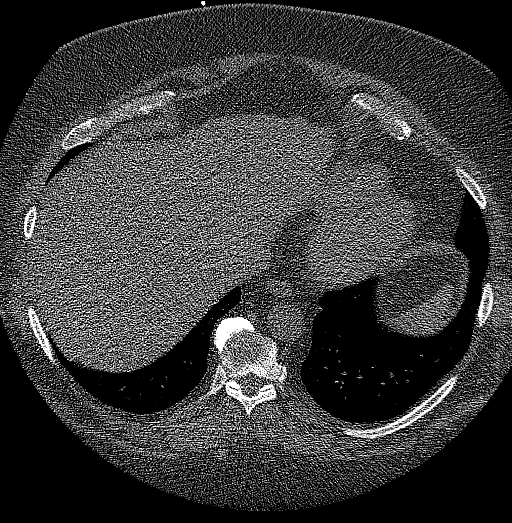
[im 27/80  vessel]
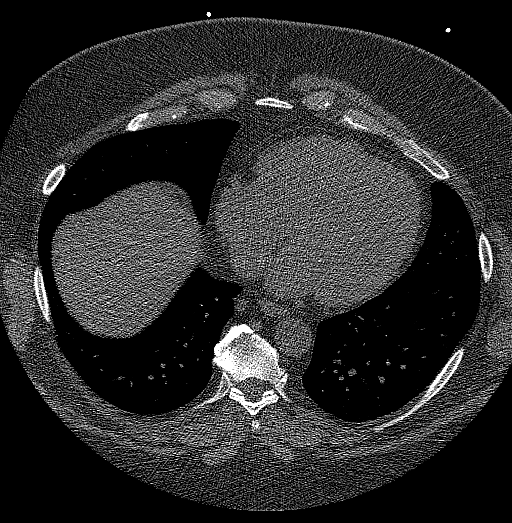
[im 40/80  vessel]
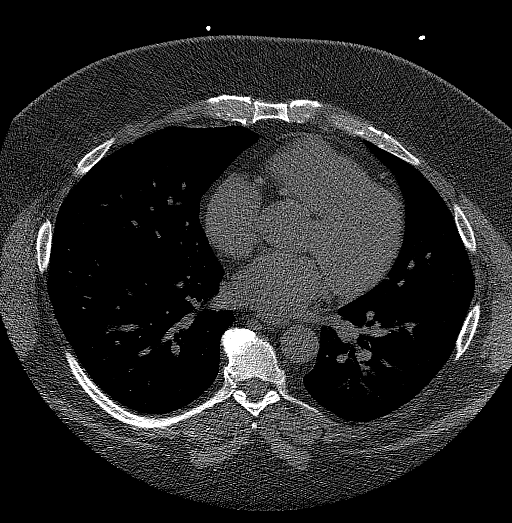
[im 53/80  vessel]
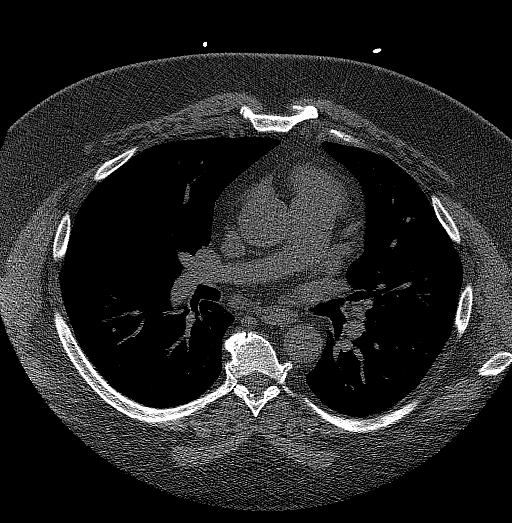
[im 66/80  vessel]
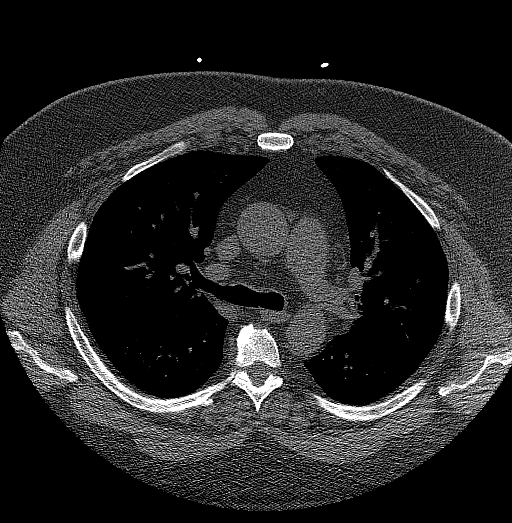

[14 of 20 positions shown; findings below may reference images not displayed]

FINDINGS: CORONARY CALCIUM SCORES:

Left Main: 0

LAD: 0

LCx: 0

RCA: 0

Total Agatston Score: 0

[HOSPITAL] percentile: 0

AORTA MEASUREMENTS:

Ascending Aorta: No 35 mm

Descending Aorta: 25 mm

OTHER FINDINGS:

Heart is normal size. Aorta normal caliber. No adenopathy. No
confluent opacities or effusions. Imaging into the upper abdomen
demonstrates no acute findings. Chest wall soft tissues are
unremarkable. No acute bony abnormality.
IMPRESSION: No visible coronary artery calcifications. Total coronary calcium
score of 0.

No acute or significant extracardiac abnormality.

## 2022-07-12 ENCOUNTER — Encounter: Payer: Self-pay | Admitting: Internal Medicine

## 2022-10-18 ENCOUNTER — Encounter: Payer: Self-pay | Admitting: Internal Medicine

## 2022-11-06 ENCOUNTER — Telehealth: Payer: Self-pay | Admitting: *Deleted

## 2022-11-06 NOTE — Telephone Encounter (Signed)
Call placed to pt.to inform him of Dr. Henrene Pastor recommendations for colonoscopy recall as of June 2028,,letter of recommendations mailed to pt. Pre-visit and colonoscopy cancelled per guidelines,pt. Verbalized understanding.

## 2022-12-19 ENCOUNTER — Encounter: Payer: No Typology Code available for payment source | Admitting: Internal Medicine

## 2023-03-06 NOTE — Patient Instructions (Addendum)
Please continue using your CPAP regularly. While your insurance requires that you use CPAP at least 4 hours each night on 70% of the nights, I recommend, that you not skip any nights and use it throughout the night if you can. Getting used to CPAP and staying with the treatment long term does take time and patience and discipline. Untreated obstructive sleep apnea when it is moderate to severe can have an adverse impact on cardiovascular health and raise her risk for heart disease, arrhythmias, hypertension, congestive heart failure, stroke and diabetes. Untreated obstructive sleep apnea causes sleep disruption, nonrestorative sleep, and sleep deprivation. This can have an impact on your day to day functioning and cause daytime sleepiness and impairment of cognitive function, memory loss, mood disturbance, and problems focussing. Using CPAP regularly can improve these symptoms.  DME: [facility] Phone: [phone], press option 1  Follow up in 1 year

## 2023-03-06 NOTE — Progress Notes (Signed)
PATIENT: Michael Liu DOB: 09-26-1971  REASON FOR VISIT: follow up HISTORY FROM: patient  Chief Complaint  Patient presents with   Follow-up    Pt in room 1 here for cpap follow up. Pt states doing well on cpap.      HISTORY OF PRESENT ILLNESS:  03/11/23 ALL:  Michael Liu returns for follow up for OSA on CPAP. He continues to do well. He is using therapy nightly for about 7.5 hours. He is using nasal pillow mask. He denies concerns with machine or supplies.     03/06/2022 ALL: Michael Liu returns for follow up for OSA on CPAP. He continues to do well. He is using CPAP nightly for about 7 hours. He feels much better rested now. He denies concerns with machine or supplies. He reports BP is usually well managed. He is followed regularly by Dr Brigitte Pulse.     02/28/2021 ALL:  Michael Liu is a 52 y.o. male here today for follow up for OSA on CPAP. Sleep study showed "Severe Complex, mostly Obstructive Sleep Apnea (OSA) at AHI 63.2/h, and REM AHI of 98.9 /h. 2. Severe sleep hypoxia for a total desaturation time of 191 minutes, nadir SpO2 at 66%". Auto PAP was ordered with pressures of 7-18cmH20. He is doing well on CPAP therapy. He does feel more energized and better rested. He denies concerns with machine but has had a leak coming from his mask. He is using a small FFM. He would like to see if another mask would be a better fit for him.   Compliance report dated 01/28/2021 through 02/26/2021 reveals that he used CPAP 29 of the past 30 days for compliance of 97%.  He is CPAP greater than 4 hours 26 of the past 30 days for compliance of 87%.  Average usage on days used was 6 hours and 41 minutes.  Residual AHI was 3.4 on 7 to 18 cm of water and an EPR of 2.  There was a leak noted in the 95th percentile of 60.9 L/min.  HISTORY: (copied from Dr Dohmeier's previous note)  Michael Liu is a 42 - year- old Caucasian male patient and seen here upon a  referral  for consultation on 09/20/2020. Referred b Dr Brigitte Pulse.   Chief concern according to patient : " I am more tired, I use Ambien to sleep, and I gained weight".    I have the pleasure of seeing Michael Liu today, a right -handed  male patient with a possible sleep disorder and he  has a past medical history of Diverticulitis, High cholesterol, Hypertension, Hypertriglyceridemia, Hypothyroidism, Insomnia, Metabolic syndrome, Nephrolithiasis, and Morbid Obesity (BMI 45). A previous sleep study in 2005(?) yielded no results.    Sleep relevant medical history: Nocturia once- some snoring.    Family medical /sleep history: No other family member with OSA, insomnia, sleep walkers.    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  He lives in a household with spouse and children. The patient currently works in daytime, 7-5 pm. No pets are  present. Tobacco use, never .  ETOH use; rare,  Caffeine intake in form of Coffee( 2 a day - but stopped 14 days ago. Stopped eating after 7 Pm.  Regular exercise - no yet.   Hobbies : football , Development worker, international aid.        Sleep habits are as follows: The patient's dinner time is between 5-6  PM. The patient goes to bed  at 10 PM and continues to sleep for 3-4 hours, wakes for  bathroom breaks, the first time at 2 AM.  Bedroom is cool, dark and quiet- The preferred sleep position is on his right side, with the support of 1 pillow.  Dreams are reportedly infrequent.  7 AM is the usual rise time. The patient wakes up spontaneously/b 5.30 and stays in bed.   He reports not feeling refreshed or restored in AM, but this improved over the last 14 days.  He often had woken with symptoms such as residual fatigue.  Naps are taken infrequently,'never been a napper" .   Review of Systems: Out of a complete 14 system review, the patient complains of only the following symptoms, and all other reviewed systems are negative.:  Fatigue, sleepiness , wife reports apnea and snoring, fragmented sleep, Insomnia -  improving. But still on ambien.   How likely are you to doze in the following situations: 0 = not likely, 1 = slight chance, 2 = moderate chance, 3 = high chance   Sitting and Reading? Watching Television? Sitting inactive in a public place (theater or meeting)? As a passenger in a car for an hour without a break? Lying down in the afternoon when circumstances permit? Sitting and talking to someone? Sitting quietly after lunch without alcohol? In a car, while stopped for a few minutes in traffic?   Total = 9/ 24 points   FSS endorsed at 20/ 63 points.    REVIEW OF SYSTEMS: Out of a complete 14 system review of symptoms, the patient complains only of the following symptoms, none and all other reviewed systems are negative.  ESS:3    ALLERGIES: Allergies  Allergen Reactions   Codeine Other (See Comments)   Percocet [Oxycodone-Acetaminophen] Other (See Comments)    Made him feel funny    HOME MEDICATIONS: Outpatient Medications Prior to Visit  Medication Sig Dispense Refill   amLODipine (NORVASC) 5 MG tablet Take 5 mg by mouth daily.     atorvastatin (LIPITOR) 20 MG tablet Take 20 mg by mouth every morning.     chlorthalidone (HYGROTON) 50 MG tablet Take 100 mg by mouth daily.     levothyroxine (SYNTHROID, LEVOTHROID) 75 MCG tablet Take 75 mcg by mouth daily before breakfast.     metoprolol succinate (TOPROL-XL) 100 MG 24 hr tablet Take 100 mg by mouth daily.     potassium chloride SA (K-DUR,KLOR-CON) 20 MEQ tablet Take 20 mEq by mouth every morning.      valsartan (DIOVAN) 320 MG tablet Take 320 mg by mouth daily.     zolpidem (AMBIEN CR) 12.5 MG CR tablet Take 12.5 mg by mouth at bedtime.     Probiotic Product (PROBIOTIC DAILY PO) Take 1 capsule by mouth daily.     No facility-administered medications prior to visit.    PAST MEDICAL HISTORY: Past Medical History:  Diagnosis Date   Diverticulitis    High cholesterol    Hypertension    Hypertriglyceridemia     Hypothyroidism    Insomnia    Metabolic syndrome    Nephrolithiasis    Obesity (BMI 30-39.9)     PAST SURGICAL HISTORY: Past Surgical History:  Procedure Laterality Date   COLONOSCOPY     TONSILLECTOMY     WISDOM TOOTH EXTRACTION      FAMILY HISTORY: Family History  Problem Relation Age of Onset   Basal cell carcinoma Mother    Hypertension Father    Hypertension Sister  Diabetes Sister    Hypertension Brother    Cancer Neg Hx    Colon cancer Neg Hx    Esophageal cancer Neg Hx    Pancreatic cancer Neg Hx    Prostate cancer Neg Hx    Rectal cancer Neg Hx    Stomach cancer Neg Hx     SOCIAL HISTORY: Social History   Socioeconomic History   Marital status: Married    Spouse name: Not on file   Number of children: Not on file   Years of education: Not on file   Highest education level: Not on file  Occupational History   Occupation: fundraiser for schools  Tobacco Use   Smoking status: Never   Smokeless tobacco: Never  Vaping Use   Vaping Use: Never used  Substance and Sexual Activity   Alcohol use: No   Drug use: No   Sexual activity: Not on file  Other Topics Concern   Not on file  Social History Narrative   Not on file   Social Determinants of Health   Financial Resource Strain: Not on file  Food Insecurity: Not on file  Transportation Needs: Not on file  Physical Activity: Not on file  Stress: Not on file  Social Connections: Not on file  Intimate Partner Violence: Not on file     PHYSICAL EXAM  Vitals:   03/11/23 1438  BP: (!) 151/89  Pulse: 62  Weight: (!) 338 lb 8 oz (153.5 kg)  Height: 5\' 9"  (1.753 m)     Body mass index is 49.99 kg/m.  Generalized: Well developed, in no acute distress  Cardiology: normal rate and rhythm, no murmur noted Respiratory: clear to auscultation bilaterally  Neurological examination  Mentation: Alert oriented to time, place, history taking. Follows all commands speech and language fluent Cranial  nerve II-XII: Pupils were equal round reactive to light. Extraocular movements were full, visual field were full  Motor: The motor testing reveals 5 over 5 strength of all 4 extremities. Good symmetric motor tone is noted throughout.  Gait and station: Gait is normal.    DIAGNOSTIC DATA (LABS, IMAGING, TESTING) - I reviewed patient records, labs, notes, testing and imaging myself where available.      No data to display           Lab Results  Component Value Date   WBC 10.9 (H) 03/05/2017   HGB 12.7 (L) 03/05/2017   HCT 37.1 (L) 03/05/2017   MCV 83.4 03/05/2017   PLT 160 03/05/2017      Component Value Date/Time   NA 139 03/05/2017 0528   K 3.0 (L) 03/05/2017 0528   CL 106 03/05/2017 0528   CO2 29 03/05/2017 0528   GLUCOSE 117 (H) 03/05/2017 0528   BUN 6 03/05/2017 0528   CREATININE 0.76 03/05/2017 0528   CALCIUM 8.2 (L) 03/05/2017 0528   PROT 7.6 03/02/2017 1138   ALBUMIN 4.1 03/02/2017 1138   AST 22 03/02/2017 1138   ALT 25 03/02/2017 1138   ALKPHOS 110 03/02/2017 1138   BILITOT 1.3 (H) 03/02/2017 1138   GFRNONAA >60 03/05/2017 0528   GFRAA >60 03/05/2017 0528   No results found for: "CHOL", "HDL", "LDLCALC", "LDLDIRECT", "TRIG", "CHOLHDL" No results found for: "HGBA1C" No results found for: "VITAMINB12" No results found for: "TSH"   ASSESSMENT AND PLAN 52 y.o. year old male  has a past medical history of Diverticulitis, High cholesterol, Hypertension, Hypertriglyceridemia, Hypothyroidism, Insomnia, Metabolic syndrome, Nephrolithiasis, and Obesity (BMI 30-39.9). here with  ICD-10-CM   1. OSA on CPAP  G47.33 For home use only DME continuous positive airway pressure (CPAP)        Michael Liu is doing well on CPAP therapy. Compliance report reveals excellent compliance. He was encouraged to continue using CPAP nightly and for greater than 4 hours each night. We will update supply orders. Risks of untreated sleep apnea review and education materials  provided. Healthy lifestyle habits encouraged. He will monitor BP at home. He will follow up in 1 year, sooner if needed. He verbalizes understanding and agreement with this plan.    Orders Placed This Encounter  Procedures   For home use only DME continuous positive airway pressure (CPAP)    Supplies    Order Specific Question:   Length of Need    Answer:   Lifetime    Order Specific Question:   Patient has OSA or probable OSA    Answer:   Yes    Order Specific Question:   Is the patient currently using CPAP in the home    Answer:   Yes    Order Specific Question:   Settings    Answer:   Other see comments    Order Specific Question:   CPAP supplies needed    Answer:   Mask, headgear, cushions, filters, heated tubing and water chamber     No orders of the defined types were placed in this encounter.       Debbora Presto, FNP-C 03/11/2023, 2:58 PM [facility] [address], [place], [place] [address] [phone]

## 2023-03-11 ENCOUNTER — Ambulatory Visit: Payer: 59 | Admitting: Family Medicine

## 2023-03-11 ENCOUNTER — Encounter: Payer: Self-pay | Admitting: Family Medicine

## 2023-03-11 VITALS — BP 151/89 | HR 62 | Ht 69.0 in | Wt 338.5 lb

## 2023-03-11 DIAGNOSIS — G4733 Obstructive sleep apnea (adult) (pediatric): Secondary | ICD-10-CM

## 2024-03-10 NOTE — Patient Instructions (Signed)

## 2024-03-10 NOTE — Progress Notes (Unsigned)
 PATIENT: Michael Liu DOB: 09/19/1971  REASON FOR VISIT: follow up HISTORY FROM: patient  No chief complaint on file.    HISTORY OF PRESENT ILLNESS:  03/10/24 ALL:  Devarious returns for follow up for OSA on CPAP. He continues to do well on therapy. He is using CPAP nightly for about     03/11/2023 ALL:  Krue returns for follow up for OSA on CPAP. He continues to do well. He is using therapy nightly for about 7.5 hours. He is using nasal pillow mask. He denies concerns with machine or supplies.     03/06/2022 ALL: Jamarl returns for follow up for OSA on CPAP. He continues to do well. He is using CPAP nightly for about 7 hours. He feels much better rested now. He denies concerns with machine or supplies. He reports BP is usually well managed. He is followed regularly by Dr Clelia Croft.     02/28/2021 ALL:  Michael Liu is a 53 y.o. male here today for follow up for OSA on CPAP. Sleep study showed "Severe Complex, mostly Obstructive Sleep Apnea (OSA) at AHI 63.2/h, and REM AHI of 98.9 /h. 2. Severe sleep hypoxia for a total desaturation time of 191 minutes, nadir SpO2 at 66%". Auto PAP was ordered with pressures of 7-18cmH20. He is doing well on CPAP therapy. He does feel more energized and better rested. He denies concerns with machine but has had a leak coming from his mask. He is using a small FFM. He would like to see if another mask would be a better fit for him.   Compliance report dated 01/28/2021 through 02/26/2021 reveals that he used CPAP 29 of the past 30 days for compliance of 97%.  He is CPAP greater than 4 hours 26 of the past 30 days for compliance of 87%.  Average usage on days used was 6 hours and 41 minutes.  Residual AHI was 3.4 on 7 to 18 cm of water and an EPR of 2.  There was a leak noted in the 95th percentile of 60.9 L/min.  HISTORY: (copied from Dr Dohmeier's previous note)  Michael Liu is a 66 - year- old Caucasian male patient and seen here upon a  referral  for  consultation on 09/20/2020. Referred b Dr Clelia Croft.  Chief concern according to patient : " I am more tired, I use Ambien to sleep, and I gained weight".    I have the pleasure of seeing Michael Liu today, a right -handed  male patient with a possible sleep disorder and he  has a past medical history of Diverticulitis, High cholesterol, Hypertension, Hypertriglyceridemia, Hypothyroidism, Insomnia, Metabolic syndrome, Nephrolithiasis, and Morbid Obesity (BMI 45). A previous sleep study in 2005(?) yielded no results.    Sleep relevant medical history: Nocturia once- some snoring.    Family medical /sleep history: No other family member with OSA, insomnia, sleep walkers.    Social history:  Patient is working as a Public relations account executive, former Designer, television/film set.  He lives in a household with spouse and children. The patient currently works in daytime, 7-5 pm. No pets are  present. Tobacco use, never .  ETOH use; rare,  Caffeine intake in form of Coffee( 2 a day - but stopped 14 days ago. Stopped eating after 7 Pm.  Regular exercise - no yet.   Hobbies : football , Academic librarian.        Sleep habits are as follows: The patient's dinner time is between 5-6  PM. The patient goes to bed at 10 PM and continues to sleep for 3-4 hours, wakes for  bathroom breaks, the first time at 2 AM.  Bedroom is cool, dark and quiet- The preferred sleep position is on his right side, with the support of 1 pillow.  Dreams are reportedly infrequent.  7 AM is the usual rise time. The patient wakes up spontaneously/b 5.30 and stays in bed.   He reports not feeling refreshed or restored in AM, but this improved over the last 14 days.  He often had woken with symptoms such as residual fatigue.  Naps are taken infrequently,'never been a napper" .   Review of Systems: Out of a complete 14 system review, the patient complains of only the following symptoms, and all other reviewed systems are negative.:  Fatigue, sleepiness , wife reports  apnea and snoring, fragmented sleep, Insomnia - improving. But still on ambien.   How likely are you to doze in the following situations: 0 = not likely, 1 = slight chance, 2 = moderate chance, 3 = high chance   Sitting and Reading? Watching Television? Sitting inactive in a public place (theater or meeting)? As a passenger in a car for an hour without a break? Lying down in the afternoon when circumstances permit? Sitting and talking to someone? Sitting quietly after lunch without alcohol? In a car, while stopped for a few minutes in traffic?   Total = 9/ 24 points   FSS endorsed at 20/ 63 points.    REVIEW OF SYSTEMS: Out of a complete 14 system review of symptoms, the patient complains only of the following symptoms, none and all other reviewed systems are negative.  ESS:3    ALLERGIES: Allergies  Allergen Reactions   Codeine Other (See Comments)   Percocet [Oxycodone-Acetaminophen] Other (See Comments)    Made him feel funny    HOME MEDICATIONS: Outpatient Medications Prior to Visit  Medication Sig Dispense Refill   amLODipine (NORVASC) 5 MG tablet Take 5 mg by mouth daily.     atorvastatin (LIPITOR) 20 MG tablet Take 20 mg by mouth every morning.     chlorthalidone (HYGROTON) 50 MG tablet Take 100 mg by mouth daily.     levothyroxine (SYNTHROID, LEVOTHROID) 75 MCG tablet Take 75 mcg by mouth daily before breakfast.     metoprolol succinate (TOPROL-XL) 100 MG 24 hr tablet Take 100 mg by mouth daily.     potassium chloride SA (K-DUR,KLOR-CON) 20 MEQ tablet Take 20 mEq by mouth every morning.      valsartan (DIOVAN) 320 MG tablet Take 320 mg by mouth daily.     zolpidem (AMBIEN CR) 12.5 MG CR tablet Take 12.5 mg by mouth at bedtime.     No facility-administered medications prior to visit.    PAST MEDICAL HISTORY: Past Medical History:  Diagnosis Date   Diverticulitis    High cholesterol    Hypertension    Hypertriglyceridemia    Hypothyroidism    Insomnia     Metabolic syndrome    Nephrolithiasis    Obesity (BMI 30-39.9)     PAST SURGICAL HISTORY: Past Surgical History:  Procedure Laterality Date   COLONOSCOPY     TONSILLECTOMY     WISDOM TOOTH EXTRACTION      FAMILY HISTORY: Family History  Problem Relation Age of Onset   Basal cell carcinoma Mother    Hypertension Father    Hypertension Sister    Diabetes Sister    Hypertension Brother  Cancer Neg Hx    Colon cancer Neg Hx    Esophageal cancer Neg Hx    Pancreatic cancer Neg Hx    Prostate cancer Neg Hx    Rectal cancer Neg Hx    Stomach cancer Neg Hx     SOCIAL HISTORY: Social History   Socioeconomic History   Marital status: Married    Spouse name: Not on file   Number of children: Not on file   Years of education: Not on file   Highest education level: Not on file  Occupational History   Occupation: fundraiser for schools  Tobacco Use   Smoking status: Never   Smokeless tobacco: Never  Vaping Use   Vaping status: Never Used  Substance and Sexual Activity   Alcohol use: No   Drug use: No   Sexual activity: Not on file  Other Topics Concern   Not on file  Social History Narrative   Not on file   Social Drivers of Health   Financial Resource Strain: Not on file  Food Insecurity: Not on file  Transportation Needs: Not on file  Physical Activity: Not on file  Stress: Not on file  Social Connections: Not on file  Intimate Partner Violence: Not on file     PHYSICAL EXAM  There were no vitals filed for this visit.    There is no height or weight on file to calculate BMI.  Generalized: Well developed, in no acute distress  Cardiology: normal rate and rhythm, no murmur noted Respiratory: clear to auscultation bilaterally  Neurological examination  Mentation: Alert oriented to time, place, history taking. Follows all commands speech and language fluent Cranial nerve II-XII: Pupils were equal round reactive to light. Extraocular movements were  full, visual field were full  Motor: The motor testing reveals 5 over 5 strength of all 4 extremities. Good symmetric motor tone is noted throughout.  Gait and station: Gait is normal.    DIAGNOSTIC DATA (LABS, IMAGING, TESTING) - I reviewed patient records, labs, notes, testing and imaging myself where available.      No data to display           Lab Results  Component Value Date   WBC 10.9 (H) 03/05/2017   HGB 12.7 (L) 03/05/2017   HCT 37.1 (L) 03/05/2017   MCV 83.4 03/05/2017   PLT 160 03/05/2017      Component Value Date/Time   NA 139 03/05/2017 0528   K 3.0 (L) 03/05/2017 0528   CL 106 03/05/2017 0528   CO2 29 03/05/2017 0528   GLUCOSE 117 (H) 03/05/2017 0528   BUN 6 03/05/2017 0528   CREATININE 0.76 03/05/2017 0528   CALCIUM 8.2 (L) 03/05/2017 0528   PROT 7.6 03/02/2017 1138   ALBUMIN 4.1 03/02/2017 1138   AST 22 03/02/2017 1138   ALT 25 03/02/2017 1138   ALKPHOS 110 03/02/2017 1138   BILITOT 1.3 (H) 03/02/2017 1138   GFRNONAA >60 03/05/2017 0528   GFRAA >60 03/05/2017 0528   No results found for: "CHOL", "HDL", "LDLCALC", "LDLDIRECT", "TRIG", "CHOLHDL" No results found for: "HGBA1C" No results found for: "VITAMINB12" No results found for: "TSH"   ASSESSMENT AND PLAN 53 y.o. year old male  has a past medical history of Diverticulitis, High cholesterol, Hypertension, Hypertriglyceridemia, Hypothyroidism, Insomnia, Metabolic syndrome, Nephrolithiasis, and Obesity (BMI 30-39.9). here with   No diagnosis found.     Michael Liu is doing well on CPAP therapy. Compliance report reveals excellent compliance. He was encouraged  to continue using CPAP nightly and for greater than 4 hours each night. We will update supply orders. Risks of untreated sleep apnea review and education materials provided. Healthy lifestyle habits encouraged. He will monitor BP at home. He will follow up in 1 year, sooner if needed. He verbalizes understanding and agreement with this  plan.    No orders of the defined types were placed in this encounter.    No orders of the defined types were placed in this encounter.       Shawnie Dapper, FNP-C 03/10/2024, 8:38 AM [facility] [address] [place], [place] [address] [phone]

## 2024-03-11 ENCOUNTER — Ambulatory Visit (INDEPENDENT_AMBULATORY_CARE_PROVIDER_SITE_OTHER): Payer: Self-pay | Admitting: Family Medicine

## 2024-03-11 ENCOUNTER — Telehealth: Payer: Self-pay | Admitting: Family Medicine

## 2024-03-11 ENCOUNTER — Encounter: Payer: Self-pay | Admitting: Family Medicine

## 2024-03-11 VITALS — BP 142/87 | HR 60 | Ht 69.0 in | Wt 332.0 lb

## 2024-03-11 DIAGNOSIS — G4733 Obstructive sleep apnea (adult) (pediatric): Secondary | ICD-10-CM

## 2024-03-11 NOTE — Progress Notes (Signed)
 Community message sent to [employer] that new order placed.

## 2024-03-11 NOTE — Telephone Encounter (Signed)
 Noted.

## 2024-03-11 NOTE — Telephone Encounter (Signed)
 Pt called to confirm appointment info, phone rep advised he was to arrive at 1:30 and if he arrives after 2pm it is likely he will be told to r/s.  Pt's response was that he will arrive a little after 2:00.  This is FYI to POD 1

## 2024-10-29 ENCOUNTER — Telehealth (HOSPITAL_COMMUNITY): Payer: Self-pay | Admitting: Cardiology

## 2024-10-29 NOTE — Telephone Encounter (Signed)
 Opened in error

## 2025-03-16 ENCOUNTER — Telehealth: Admitting: Family Medicine
